# Patient Record
Sex: Female | Born: 1965 | Race: White | Hispanic: No | Marital: Single | Smoking: Current every day smoker
Health system: Southern US, Community
[De-identification: ages and names within clinical notes are randomized; demographics above are authoritative.]

## PROBLEM LIST (undated history)

## (undated) DIAGNOSIS — T8859XA Other complications of anesthesia, initial encounter: Secondary | ICD-10-CM

## (undated) DIAGNOSIS — T4145XA Adverse effect of unspecified anesthetic, initial encounter: Secondary | ICD-10-CM

## (undated) DIAGNOSIS — M1990 Unspecified osteoarthritis, unspecified site: Secondary | ICD-10-CM

## (undated) DIAGNOSIS — I10 Essential (primary) hypertension: Secondary | ICD-10-CM

## (undated) HISTORY — PX: KNEE ARTHROSCOPY: SUR90

## (undated) HISTORY — PX: TONSILLECTOMY: SUR1361

## (undated) HISTORY — PX: WRIST ARTHROPLASTY: SHX1088

## (undated) HISTORY — PX: CHOLECYSTECTOMY: SHX55

## (undated) HISTORY — PX: SHOULDER ARTHROSCOPY: SHX128

## (undated) HISTORY — PX: FRACTURE SURGERY: SHX138

## (undated) HISTORY — PX: ABDOMINAL HYSTERECTOMY: SHX81

## (undated) HISTORY — PX: APPENDECTOMY: SHX54

---

## 1998-06-18 ENCOUNTER — Ambulatory Visit (HOSPITAL_COMMUNITY): Admission: RE | Admit: 1998-06-18 | Discharge: 1998-06-18 | Payer: Self-pay | Admitting: Family Medicine

## 1998-06-18 ENCOUNTER — Encounter: Payer: Self-pay | Admitting: Family Medicine

## 1999-02-26 ENCOUNTER — Other Ambulatory Visit: Admission: RE | Admit: 1999-02-26 | Discharge: 1999-02-26 | Payer: Self-pay | Admitting: Obstetrics and Gynecology

## 1999-03-26 ENCOUNTER — Ambulatory Visit (HOSPITAL_COMMUNITY): Admission: RE | Admit: 1999-03-26 | Discharge: 1999-03-26 | Payer: Self-pay | Admitting: *Deleted

## 1999-03-26 ENCOUNTER — Encounter: Payer: Self-pay | Admitting: *Deleted

## 1999-07-23 ENCOUNTER — Ambulatory Visit (HOSPITAL_COMMUNITY): Admission: RE | Admit: 1999-07-23 | Discharge: 1999-07-23 | Payer: Self-pay | Admitting: Family Medicine

## 1999-07-23 ENCOUNTER — Encounter: Payer: Self-pay | Admitting: Family Medicine

## 1999-08-22 ENCOUNTER — Other Ambulatory Visit: Admission: RE | Admit: 1999-08-22 | Discharge: 1999-08-22 | Payer: Self-pay | Admitting: Obstetrics and Gynecology

## 2000-03-10 ENCOUNTER — Other Ambulatory Visit: Admission: RE | Admit: 2000-03-10 | Discharge: 2000-03-10 | Payer: Self-pay | Admitting: Obstetrics and Gynecology

## 2001-06-11 ENCOUNTER — Ambulatory Visit (HOSPITAL_COMMUNITY): Admission: RE | Admit: 2001-06-11 | Discharge: 2001-06-11 | Payer: Self-pay | Admitting: Orthopedic Surgery

## 2001-06-11 ENCOUNTER — Encounter: Payer: Self-pay | Admitting: Orthopedic Surgery

## 2001-07-27 ENCOUNTER — Ambulatory Visit (HOSPITAL_BASED_OUTPATIENT_CLINIC_OR_DEPARTMENT_OTHER): Admission: RE | Admit: 2001-07-27 | Discharge: 2001-07-27 | Payer: Self-pay | Admitting: Orthopedic Surgery

## 2001-08-06 ENCOUNTER — Other Ambulatory Visit: Admission: RE | Admit: 2001-08-06 | Discharge: 2001-08-06 | Payer: Self-pay | Admitting: Obstetrics and Gynecology

## 2002-03-09 ENCOUNTER — Ambulatory Visit (HOSPITAL_COMMUNITY): Admission: RE | Admit: 2002-03-09 | Discharge: 2002-03-09 | Payer: Self-pay | Admitting: Family Medicine

## 2002-03-09 ENCOUNTER — Encounter: Payer: Self-pay | Admitting: Family Medicine

## 2002-03-15 ENCOUNTER — Encounter: Payer: Self-pay | Admitting: Family Medicine

## 2002-03-15 ENCOUNTER — Ambulatory Visit (HOSPITAL_COMMUNITY): Admission: RE | Admit: 2002-03-15 | Discharge: 2002-03-15 | Payer: Self-pay | Admitting: Family Medicine

## 2002-08-11 ENCOUNTER — Encounter: Payer: Self-pay | Admitting: Obstetrics and Gynecology

## 2002-08-11 ENCOUNTER — Ambulatory Visit (HOSPITAL_COMMUNITY): Admission: RE | Admit: 2002-08-11 | Discharge: 2002-08-11 | Payer: Self-pay | Admitting: Obstetrics and Gynecology

## 2002-08-23 ENCOUNTER — Other Ambulatory Visit: Admission: RE | Admit: 2002-08-23 | Discharge: 2002-08-23 | Payer: Self-pay | Admitting: Obstetrics and Gynecology

## 2003-05-02 ENCOUNTER — Ambulatory Visit (HOSPITAL_COMMUNITY): Admission: RE | Admit: 2003-05-02 | Discharge: 2003-05-02 | Payer: Self-pay | Admitting: Orthopedic Surgery

## 2003-05-17 ENCOUNTER — Ambulatory Visit (HOSPITAL_BASED_OUTPATIENT_CLINIC_OR_DEPARTMENT_OTHER): Admission: RE | Admit: 2003-05-17 | Discharge: 2003-05-17 | Payer: Self-pay | Admitting: Orthopedic Surgery

## 2003-06-21 ENCOUNTER — Ambulatory Visit (HOSPITAL_BASED_OUTPATIENT_CLINIC_OR_DEPARTMENT_OTHER): Admission: RE | Admit: 2003-06-21 | Discharge: 2003-06-21 | Payer: Self-pay | Admitting: Orthopedic Surgery

## 2003-10-04 ENCOUNTER — Other Ambulatory Visit: Admission: RE | Admit: 2003-10-04 | Discharge: 2003-10-04 | Payer: Self-pay | Admitting: Obstetrics and Gynecology

## 2004-08-08 ENCOUNTER — Ambulatory Visit (HOSPITAL_BASED_OUTPATIENT_CLINIC_OR_DEPARTMENT_OTHER): Admission: RE | Admit: 2004-08-08 | Discharge: 2004-08-08 | Payer: Self-pay | Admitting: Orthopedic Surgery

## 2004-10-17 ENCOUNTER — Emergency Department: Payer: Self-pay | Admitting: General Practice

## 2004-11-07 ENCOUNTER — Other Ambulatory Visit: Admission: RE | Admit: 2004-11-07 | Discharge: 2004-11-07 | Payer: Self-pay | Admitting: Obstetrics and Gynecology

## 2005-02-28 ENCOUNTER — Other Ambulatory Visit: Payer: Self-pay

## 2005-02-28 ENCOUNTER — Emergency Department: Payer: Self-pay | Admitting: Unknown Physician Specialty

## 2005-08-12 ENCOUNTER — Ambulatory Visit (HOSPITAL_COMMUNITY): Admission: RE | Admit: 2005-08-12 | Discharge: 2005-08-12 | Payer: Self-pay | Admitting: Gastroenterology

## 2005-08-15 ENCOUNTER — Ambulatory Visit (HOSPITAL_COMMUNITY): Admission: RE | Admit: 2005-08-15 | Discharge: 2005-08-15 | Payer: Self-pay | Admitting: Gastroenterology

## 2006-01-12 ENCOUNTER — Encounter: Admission: RE | Admit: 2006-01-12 | Discharge: 2006-01-12 | Payer: Self-pay | Admitting: Obstetrics and Gynecology

## 2006-08-04 ENCOUNTER — Ambulatory Visit: Payer: Self-pay | Admitting: Urology

## 2011-02-20 ENCOUNTER — Observation Stay: Payer: Self-pay | Admitting: Student

## 2011-02-21 DIAGNOSIS — R079 Chest pain, unspecified: Secondary | ICD-10-CM

## 2011-03-07 ENCOUNTER — Encounter: Payer: Self-pay | Admitting: Cardiovascular Disease

## 2011-03-28 ENCOUNTER — Encounter: Payer: Self-pay | Admitting: Cardiovascular Disease

## 2011-04-10 ENCOUNTER — Encounter: Payer: Self-pay | Admitting: Cardiovascular Disease

## 2012-05-04 ENCOUNTER — Encounter (HOSPITAL_BASED_OUTPATIENT_CLINIC_OR_DEPARTMENT_OTHER): Payer: Self-pay | Admitting: *Deleted

## 2012-05-04 NOTE — Progress Notes (Signed)
Multiple surgeries-heartrate slow-has dropped to 28-keep atropine available.

## 2012-05-05 NOTE — H&P (Signed)
  [facility] [address]   [address] [place], [place] [address] [phone] A Division of [facility]  Loreta Ave, M.D.   Robert A. Thurston Hole, M.D.   Burnell Blanks, M.D.   Eulas Post, M.D.   Lunette Stands, M.D Buford Dresser, M.D.  Charlsie Quest, M.D.   Estell Harpin, M.D.   Melina Fiddler, M.D. Genene Churn. Barry Dienes, PA-C            Kirstin A. Shepperson, PA-C Josh [place], PA-C [place], [place]   RE: [place], [place]                                [id number]      DOB: December 14, 1965 PROGRESS NOTE: 03-12-12 Makaley comes in for follow up for continued symptoms in her right knee.  Seen and evaluated by me back in 2011.  I have also treated her back going almost ten years.  A picture of lateral tracking, chondromalacia patellae, lateral tethering.  Eventually coming down to operative intervention on the left knee with exam under anesthesia, excision plica, debridement of meniscus tear, chondroplasty and lateral retinacular release.  She did well on that side.  Improved symptoms.  She has continued to do well on that side.  The right knee has had continued marked significant retropatellar irritation and symptoms.  Getting more buckling, catching and giving way.  We discussed back 2 years ago exam under anesthesia, arthroscopy, chondroplasty and lateral release.  She has finally gotten to a point where she wants to consider that.  She is currently working, but has no insurance.  She would like to forgo any further workup or testing.  Otherwise in excellent health. Her history and general exam is outlined and included in the chart.    EXAMINATION: Specifically, very healthy appearing 46 year-old female.  A little bit of an antalgic gait on the right.  On the left she has full motion.  She has good strength.  Good stability.  Continues to have reasonable patella tracking without much crepitus and still feels freed up from her  lateral release done more than a decade ago.  On the right she has lateral tracking and tethering patellofemoral joint.  Grating and crepitus at least 3+.  A little bit sore over all her joint lines, but I am not getting a McMurray's.  Ligaments are stable.  Neurovascularly intact distally.  X-RAYS: I did repeat a three view x-ray today.  I don't see any loose bodies or other changes.    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  We have discussed definitive treatment.  Ideally I would like to get an MRI to look at all structures, but she does not want to do that.  Discussed exam under anesthesia, arthroscopy and chondroplasty.  In all likelihood adding lateral release.  Based on her age of 42 and her Q angle, I really don't think consideration of distal realignment with a Fulkerson should be entertained.  Would certainly try her scope and lateral release first.  I went over all of this with her and answered all her questions.  I will see her at the time of operative intervention.    Loreta Ave, M.D.   Electronically verified by Loreta Ave, M.D. DFM:jjh D 03-12-12 T 03-13-12

## 2012-05-06 ENCOUNTER — Encounter (HOSPITAL_BASED_OUTPATIENT_CLINIC_OR_DEPARTMENT_OTHER): Payer: Self-pay | Admitting: Certified Registered"

## 2012-05-06 ENCOUNTER — Ambulatory Visit (HOSPITAL_BASED_OUTPATIENT_CLINIC_OR_DEPARTMENT_OTHER): Payer: Self-pay | Admitting: Certified Registered"

## 2012-05-06 ENCOUNTER — Encounter (HOSPITAL_BASED_OUTPATIENT_CLINIC_OR_DEPARTMENT_OTHER): Payer: Self-pay

## 2012-05-06 ENCOUNTER — Encounter (HOSPITAL_BASED_OUTPATIENT_CLINIC_OR_DEPARTMENT_OTHER)
Admission: RE | Disposition: A | Discharge status: Home / Self Care | Payer: Self-pay | Source: Ambulatory Visit | Attending: Orthopedic Surgery

## 2012-05-06 ENCOUNTER — Ambulatory Visit (HOSPITAL_BASED_OUTPATIENT_CLINIC_OR_DEPARTMENT_OTHER)
Admission: RE | Admit: 2012-05-06 | Discharge: 2012-05-06 | Disposition: A | Discharge status: Home / Self Care | Payer: Self-pay | Source: Ambulatory Visit | Attending: Orthopedic Surgery | Admitting: Orthopedic Surgery

## 2012-05-06 DIAGNOSIS — Z9889 Other specified postprocedural states: Secondary | ICD-10-CM

## 2012-05-06 DIAGNOSIS — M6750 Plica syndrome, unspecified knee: Secondary | ICD-10-CM | POA: Insufficient documentation

## 2012-05-06 DIAGNOSIS — M2240 Chondromalacia patellae, unspecified knee: Secondary | ICD-10-CM | POA: Insufficient documentation

## 2012-05-06 HISTORY — DX: Unspecified osteoarthritis, unspecified site: M19.90

## 2012-05-06 HISTORY — DX: Other complications of anesthesia, initial encounter: T88.59XA

## 2012-05-06 HISTORY — DX: Adverse effect of unspecified anesthetic, initial encounter: T41.45XA

## 2012-05-06 HISTORY — PX: KNEE ARTHROSCOPY WITH LATERAL RELEASE: SHX5649

## 2012-05-06 SURGERY — ARTHROSCOPY, KNEE, WITH LATERAL RETINACULUM RELEASE
Anesthesia: General | Laterality: Right

## 2012-05-06 MED ORDER — ONDANSETRON HCL 4 MG/2ML IJ SOLN: 4.0000 mg | Freq: Once | INTRAMUSCULAR | Status: DC | PRN

## 2012-05-06 MED ORDER — ONDANSETRON HCL 4 MG/2ML IJ SOLN
INTRAMUSCULAR | Status: DC | PRN
  Administered 2012-05-06: 4 mg via INTRAVENOUS

## 2012-05-06 MED ORDER — PROPOFOL 10 MG/ML IV BOLUS
INTRAVENOUS | Status: DC | PRN
  Administered 2012-05-06: 180 mg via INTRAVENOUS

## 2012-05-06 MED ORDER — GLYCOPYRROLATE 0.2 MG/ML IJ SOLN
INTRAMUSCULAR | Status: DC | PRN
  Administered 2012-05-06: 0.2 mg via INTRAVENOUS

## 2012-05-06 MED ORDER — FENTANYL CITRATE 0.05 MG/ML IJ SOLN
INTRAMUSCULAR | Status: DC | PRN
  Administered 2012-05-06 (×2): 25 ug via INTRAVENOUS
  Administered 2012-05-06: 100 ug via INTRAVENOUS

## 2012-05-06 MED ORDER — HYDROMORPHONE HCL PF 1 MG/ML IJ SOLN
0.2500 mg | INTRAMUSCULAR | Status: DC | PRN
  Administered 2012-05-06 (×2): 0.5 mg via INTRAVENOUS

## 2012-05-06 MED ORDER — LIDOCAINE HCL (CARDIAC) 20 MG/ML IV SOLN
INTRAVENOUS | Status: DC | PRN
  Administered 2012-05-06: 60 mg via INTRAVENOUS

## 2012-05-06 MED ORDER — OXYCODONE HCL 5 MG PO TABS
5.0000 mg | ORAL_TABLET | Freq: Once | ORAL | Status: AC | PRN
  Administered 2012-05-06: 5 mg via ORAL

## 2012-05-06 MED ORDER — OXYCODONE HCL 5 MG/5ML PO SOLN: 5.0000 mg | Freq: Once | ORAL | Status: AC | PRN

## 2012-05-06 MED ORDER — SODIUM CHLORIDE 0.9 % IR SOLN
Status: DC | PRN
  Administered 2012-05-06: 3000 mL

## 2012-05-06 MED ORDER — BUPIVACAINE HCL (PF) 0.25 % IJ SOLN
INTRAMUSCULAR | Status: DC | PRN
  Administered 2012-05-06: 20 mL

## 2012-05-06 MED ORDER — CHLORHEXIDINE GLUCONATE 4 % EX LIQD: 60.0000 mL | Freq: Once | CUTANEOUS | Status: DC

## 2012-05-06 MED ORDER — LACTATED RINGERS IV SOLN
INTRAVENOUS | Status: DC
  Administered 2012-05-06 (×2): via INTRAVENOUS

## 2012-05-06 MED ORDER — CEFAZOLIN SODIUM-DEXTROSE 2-3 GM-% IV SOLR
2.0000 g | INTRAVENOUS | Status: AC
  Administered 2012-05-06: 2 g via INTRAVENOUS

## 2012-05-06 MED ORDER — DEXAMETHASONE SODIUM PHOSPHATE 4 MG/ML IJ SOLN
INTRAMUSCULAR | Status: DC | PRN
  Administered 2012-05-06: 10 mg via INTRAVENOUS

## 2012-05-06 MED ORDER — OXYCODONE-ACETAMINOPHEN 5-325 MG PO TABS: 1.0000 | ORAL_TABLET | ORAL | Status: DC | PRN

## 2012-05-06 MED ORDER — MIDAZOLAM HCL 5 MG/5ML IJ SOLN
INTRAMUSCULAR | Status: DC | PRN
  Administered 2012-05-06: 2 mg via INTRAVENOUS

## 2012-05-06 SURGICAL SUPPLY — 42 items
BANDAGE ELASTIC 6 VELCRO ST LF (GAUZE/BANDAGES/DRESSINGS) ×2 IMPLANT
BLADE CUDA 5.5 (BLADE) IMPLANT
BLADE CUDA GRT WHITE 3.5 (BLADE) IMPLANT
BLADE CUTTER GATOR 3.5 (BLADE) ×2 IMPLANT
BLADE CUTTER MENIS 5.5 (BLADE) IMPLANT
BLADE GREAT WHITE 4.2 (BLADE) ×2 IMPLANT
BUR OVAL 4.0 (BURR) IMPLANT
CANISTER OMNI JUG 16 LITER (MISCELLANEOUS) ×2 IMPLANT
CANISTER SUCTION 2500CC (MISCELLANEOUS) IMPLANT
CLOTH BEACON ORANGE TIMEOUT ST (SAFETY) ×2 IMPLANT
CUTTER MENISCUS  4.2MM (BLADE)
CUTTER MENISCUS 4.2MM (BLADE) IMPLANT
DRAPE ARTHROSCOPY W/POUCH 90 (DRAPES) ×2 IMPLANT
DRSG PAD ABDOMINAL 8X10 ST (GAUZE/BANDAGES/DRESSINGS) ×1 IMPLANT
DURAPREP 26ML APPLICATOR (WOUND CARE) ×2 IMPLANT
ELECT MENISCUS 165MM 90D (ELECTRODE) ×1 IMPLANT
ELECT REM PT RETURN 9FT ADLT (ELECTROSURGICAL) ×2
ELECTRODE REM PT RTRN 9FT ADLT (ELECTROSURGICAL) IMPLANT
GAUZE XEROFORM 1X8 LF (GAUZE/BANDAGES/DRESSINGS) ×2 IMPLANT
GLOVE BIOGEL PI IND STRL 7.5 (GLOVE) ×1 IMPLANT
GLOVE BIOGEL PI IND STRL 8 (GLOVE) ×1 IMPLANT
GLOVE BIOGEL PI INDICATOR 7.5 (GLOVE) ×1
GLOVE BIOGEL PI INDICATOR 8 (GLOVE) ×2
GLOVE ORTHO TXT STRL SZ7.5 (GLOVE) ×4 IMPLANT
GOWN PREVENTION PLUS XLARGE (GOWN DISPOSABLE) ×2 IMPLANT
GOWN STRL REIN 2XL XLG LVL4 (GOWN DISPOSABLE) ×2 IMPLANT
HOLDER KNEE FOAM BLUE (MISCELLANEOUS) ×2 IMPLANT
IMMOBILIZER KNEE 24 THIGH 36 (MISCELLANEOUS) ×1 IMPLANT
IMMOBILIZER KNEE 24 UNIV (MISCELLANEOUS) ×2
KNEE WRAP E Z 3 GEL PACK (MISCELLANEOUS) ×1 IMPLANT
PACK ARTHROSCOPY DSU (CUSTOM PROCEDURE TRAY) ×2 IMPLANT
PACK BASIN DAY SURGERY FS (CUSTOM PROCEDURE TRAY) ×2 IMPLANT
PAD CAST 4YDX4 CTTN HI CHSV (CAST SUPPLIES) IMPLANT
PADDING CAST COTTON 4X4 STRL (CAST SUPPLIES) ×2
PENCIL BUTTON HOLSTER BLD 10FT (ELECTRODE) ×1 IMPLANT
SET ARTHROSCOPY TUBING (MISCELLANEOUS) ×2
SET ARTHROSCOPY TUBING LN (MISCELLANEOUS) ×1 IMPLANT
SPONGE GAUZE 4X4 12PLY (GAUZE/BANDAGES/DRESSINGS) ×4 IMPLANT
SUT ETHILON 3 0 PS 1 (SUTURE) ×2 IMPLANT
SUT VIC AB 3-0 FS2 27 (SUTURE) IMPLANT
TOWEL OR 17X24 6PK STRL BLUE (TOWEL DISPOSABLE) ×2 IMPLANT
WATER STERILE IRR 1000ML POUR (IV SOLUTION) ×1 IMPLANT

## 2012-05-06 NOTE — Anesthesia Preprocedure Evaluation (Signed)

## 2012-05-06 NOTE — Brief Op Note (Signed)
05/06/2012  1:05 PM  PATIENT:  Jamie Johnson  46 y.o. female  PRE-OPERATIVE DIAGNOSIS:  RIGHT KNEE DEGENERATIVE ARTHRITIS/CHONDROMALACIA  POST-OPERATIVE DIAGNOSIS:  RIGHT KNEE DEGENERATIVE ARTHRITIS/CHONDROMALACIA  PROCEDURE:  Procedure(s) (LRB) with comments: KNEE ARTHROSCOPY WITH LATERAL RELEASE (Right) - RIGHT KNEE ARTHROSCOPY WITH LATERAL RELEASE ,Excision Plica, Chondroplasty   SURGEON:  Surgeon(s) and Role:    * Loreta Ave, MD - Primary  PHYSICIAN ASSISTANT: Zonia Kief M   ANESTHESIA:   general  EBL:  Total I/O In: 1844 [P.O.:444; I.V.:1400] Out: -   SPECIMEN:  No Specimen  DISPOSITION OF SPECIMEN:  N/A   PATIENT DISPOSITION:  PACU - hemodynamically stable.

## 2012-05-06 NOTE — Anesthesia Procedure Notes (Signed)
Procedure Name: LMA Insertion Date/Time: 05/06/2012 7:36 AM Performed by: Verlan Friends Pre-anesthesia Checklist: Patient identified, Emergency Drugs available, Suction available, Patient being monitored and Timeout performed Patient Re-evaluated:Patient Re-evaluated prior to inductionOxygen Delivery Method: Circle System Utilized Preoxygenation: Pre-oxygenation with 100% oxygen Intubation Type: IV induction Ventilation: Mask ventilation without difficulty LMA: LMA inserted LMA Size: 4.0 Number of attempts: 1 Airway Equipment and Method: bite block Placement Confirmation: positive ETCO2 Tube secured with: Tape Dental Injury: Teeth and Oropharynx as per pre-operative assessment

## 2012-05-06 NOTE — Transfer of Care (Signed)
Immediate Anesthesia Transfer of Care Note  Patient: Jamie Johnson  Procedure(s) Performed: Procedure(s) (LRB) with comments: KNEE ARTHROSCOPY WITH LATERAL RELEASE (Right) - RIGHT KNEE ARTHROSCOPY WITH LATERAL RELEASE ,Excision Plica, Chondroplasty   Patient Location: PACU  Anesthesia Type:General  Level of Consciousness: awake, alert , oriented and patient cooperative  Airway & Oxygen Therapy: Patient Spontanous Breathing and Patient connected to face mask oxygen  Post-op Assessment: Report given to PACU RN and Post -op Vital signs reviewed and stable  Post vital signs: Reviewed and stable  Complications: No apparent anesthesia complications

## 2012-05-06 NOTE — Interval H&P Note (Signed)
History and Physical Interval Note:  05/06/2012 7:24 AM  Jamie Johnson  has presented today for surgery, with the diagnosis of RIGHT KNEE DEGENERATIVE ARTHRITIS/CHONDROMALACIA/PATELLA  The various methods of treatment have been discussed with the patient and family. After consideration of risks, benefits and other options for treatment, the patient has consented to  Procedure(s) (LRB) with comments: KNEE ARTHROSCOPY WITH LATERAL RELEASE (Right) - RIGHT KNEE ARTHROSCOPY WITH LATERAL RELEASE,DEBRIDEMENT,SHAVING (SHONDROPLASTY)  as a surgical intervention .  The patient's history has been reviewed, patient examined, no change in status, stable for surgery.  I have reviewed the patient's chart and labs.  Questions were answered to the patient's satisfaction.     [name]

## 2012-05-06 NOTE — Anesthesia Postprocedure Evaluation (Signed)
  Anesthesia Post-op Note  Patient: Jamie Johnson  Procedure(s) Performed: Procedure(s) (LRB) with comments: KNEE ARTHROSCOPY WITH LATERAL RELEASE (Right) - RIGHT KNEE ARTHROSCOPY WITH LATERAL RELEASE ,Excision Plica, Chondroplasty   Patient Location: PACU  Anesthesia Type:General  Level of Consciousness: awake, alert  and oriented  Airway and Oxygen Therapy: Patient Spontanous Breathing and Patient connected to face mask oxygen  Post-op Pain: mild  Post-op Assessment: Post-op Vital signs reviewed  Post-op Vital Signs: Reviewed  Complications: No apparent anesthesia complications

## 2012-05-07 ENCOUNTER — Encounter (HOSPITAL_BASED_OUTPATIENT_CLINIC_OR_DEPARTMENT_OTHER): Payer: Self-pay | Admitting: Orthopedic Surgery

## 2012-05-07 NOTE — Op Note (Signed)
NAMEJANNICE, Jamie Johnson NO.:  [id number]  MEDICAL RECORD NO.:  [id number]  LOCATION:                                 FACILITY:  PHYSICIAN:  Loreta Ave, M.D.      DATE OF BIRTH:  DATE OF PROCEDURE:  05/06/2012 DATE OF DISCHARGE:                              OPERATIVE REPORT   PREOPERATIVE DIAGNOSIS:  Right knee chondromalacia of patella with lateral tracking, tethering patellofemoral joint.  Medial plica.  POSTOPERATIVE DIAGNOSES:  Right knee chondromalacia of patella with lateral tracking, tethering patellofemoral joint.  Medial plica.  Grade 2 and 3 changes in peak of the patella.  PROCEDURE:  Right knee exam under anesthesia, arthroscopy. Chondroplasty of patella.  Excision of medial plica.  Arthroscopic lateral retinacular release.  SURGEON:  Loreta Ave, M.D.  ASSISTANT:  Genene Churn. Denton Meek.  ANESTHESIA:  General.  BLOOD LOSS:  Minimal.  SPECIMENS:  None.  CULTURES:  None.  COMPLICATION:  None.  DRESSINGS:  Soft compressive with lateral bolster.  PROCEDURE:  The patient was brought to the operating room, placed on the operating table in supine position.  After adequate anesthesia had been obtained, knee examined.  Full motion with stable ligamentous.  Lateral tracking, lateral tethering confirmed.  No instability.  Leg holder applied.  Leg was prepped and draped in usual sterile fashion.  Two portals; one in each medial and lateral parapatellar.  Arthroscope was introduced.  Knee was distended and inspected.  Confirmed lateral tracking and tethering.  Focal grade 2 and 3 changes in peak of the patella debrided.  Large fibrotic medial plica excised.  Trochlea intact.  Remaining knee looked good with the exception of some mild changes in lateral tibial plateau, grade 2.  Medial meniscus, lateral meniscus, cruciate ligaments intact.  After chondroplasty and plica excision, tracking assessed from all angles.  Sufficient tethering  and torn released.  Cautery was used to release the lateral retinaculum from the vastus lateralis superiorly down the lateral joint line inferiorly. Marked improvement of tethering after release.  Instruments and fluids were removed. Portals were closed with nylon.  Knee was injected with Marcaine. Sterile compressive dressing was applied with lateral bolster. Anesthesia was reversed.  Brought to the recovery room.  Tolerated the surgery well.  No complications.     Loreta Ave, M.D.     DFM/MEDQ  D:  05/06/2012  T:  05/07/2012  Job:  [id number]

## 2012-12-06 ENCOUNTER — Other Ambulatory Visit (HOSPITAL_COMMUNITY): Payer: Self-pay | Admitting: Obstetrics and Gynecology

## 2012-12-06 DIAGNOSIS — R7989 Other specified abnormal findings of blood chemistry: Secondary | ICD-10-CM

## 2012-12-09 ENCOUNTER — Other Ambulatory Visit (HOSPITAL_COMMUNITY): Payer: Self-pay

## 2012-12-13 ENCOUNTER — Ambulatory Visit (HOSPITAL_COMMUNITY)
Admission: RE | Admit: 2012-12-13 | Discharge: 2012-12-13 | Disposition: A | Discharge status: Home / Self Care | Payer: BC Managed Care – PPO | Source: Ambulatory Visit | Attending: Obstetrics and Gynecology | Admitting: Obstetrics and Gynecology

## 2012-12-13 DIAGNOSIS — R599 Enlarged lymph nodes, unspecified: Secondary | ICD-10-CM | POA: Insufficient documentation

## 2012-12-13 DIAGNOSIS — R946 Abnormal results of thyroid function studies: Secondary | ICD-10-CM | POA: Insufficient documentation

## 2012-12-13 DIAGNOSIS — E041 Nontoxic single thyroid nodule: Secondary | ICD-10-CM | POA: Insufficient documentation

## 2012-12-13 DIAGNOSIS — R7989 Other specified abnormal findings of blood chemistry: Secondary | ICD-10-CM

## 2013-03-03 ENCOUNTER — Ambulatory Visit: Payer: Self-pay | Admitting: Anesthesiology

## 2013-03-15 ENCOUNTER — Ambulatory Visit: Payer: Self-pay | Admitting: Unknown Physician Specialty

## 2014-10-06 NOTE — Op Note (Signed)
PATIENT NAMOla Johnson:  Azbell, Casha MR#:  [id number] DATE OF BIRTH:  07-10-1965  DATE OF PROCEDURE:  03/15/2013  PREOPERATIVE DIAGNOSIS: Right thyroid nodule.  POSTOPERATIVE DIAGNOSIS: Right thyroid nodule.  ATTENDING SURGEON: Davina Pokehapman T. Federico Maiorino, M.D.   ASSISTANT SURGEON: Wardell HeathBennet.  OPERATION PERFORMED:  1. Right hemithyroidectomy.  2. Laryngeal nerve monitoring.  OPERATIVE FINDINGS: Relatively normal appearing, slightly enlarged right lobe of the thyroid with a intrathyroid nodule measuring approximately 1 cm   DESCRIPTION OF PROCEDURE: Marylene Landngela was identified in the holding area, taken to the operating room and placed in the supine position. After general endotracheal anesthesia with a laryngeal nerve monitor. Once intubated and in position the neck was gently extended and the anterior neck was identified and marked. A local anesthetic of 1% lidocaine with 1:100,000 epinephrine was used to inject along the skin crease tension line. Approximately 4 mL were used. The neck was then prepped and draped sterilely, a 15 blade was used to incise down to and through the platysma muscle. Hemostasis was achieved using the Bovie cautery. The midline was identified and gently divided using the Harmonic scalpel. On the right-hand side the strap muscles were gently elevated away from the right lobe of the thyroid. There were several feeding vessels which were divided using the Harmonic scalpel. The superior pole of thyroid gland was identified and isolated, and the superior pole vessels were divided using the Harmonic scalpel. The gland was then gently medialized. There were several feeding vessels laterally which were also divided using the Harmonic scalpel. The superior and inferior parathyroid glands were identified and left on their vascular pedicles intact. The recurrent laryngeal nerve was also identified in the tracheoesophageal groove. This was stimulated and left intact throughout the case. Once the nerve was  identified and traced up to its entrance the larynx, the gland was retracted medially, Berry's ligament was released. There were several other medial vessels which were also divided using the Harmonic scalpel. The gland was then peeled off the anterior tracheal wall. The inferior pole vessels were also identified and divided using the Harmonic scalpel. As the gland was peeled more towards the left the isthmus was identified. The gland was then divided right from left lobe including the isthmus in the specimen. This was divided using the Harmonic scalpel. A stitch was placed in the superior pole on the right. This was then sent for permanent section. The right neck was then copiously irrigated with saline. Any small bleeding areas were cauterized using the microbipolar. At the end of the case the nerve was stimulated and was intact and stimulated at the end of the case. A #10 TLS drain was brought out of wound inferiorly. A small piece of Surgicel was placed in the neurovascular bed. The strap muscles were reapproximated in the midline using 4-0 Vicryl. The platysmal layer was closed using 4-0 Vicryl. The subcutaneous layers and skin were closed using 4-0 Vicryl and the epidermis was closed using Dermabond. A Tegaderm was placed over the drain to keep it in position. The patient was then returned to anesthesia where she was awakened in the operating room and taken to the recovery room in stable condition.   CULTURES: None.   SPECIMENS: Right thyroid lobe.   ESTIMATED BLOOD LOSS: Less than 20 mL.   ____________________________ Davina Pokehapman T. Wenzel Backlund, MD ctm:sg D: 03/15/2013 09:55:39 ET T: 03/15/2013 11:13:51 ET JOB#: [id number]  cc: Davina Pokehapman T. Karem Tomaso, MD, <Dictator> Davina PokeHAPMAN T Shan Valdes MD ELECTRONICALLY SIGNED 03/29/2013 9:43

## 2014-12-01 ENCOUNTER — Other Ambulatory Visit: Payer: Self-pay | Admitting: Family Medicine

## 2014-12-01 DIAGNOSIS — E039 Hypothyroidism, unspecified: Secondary | ICD-10-CM

## 2015-01-03 ENCOUNTER — Other Ambulatory Visit: Payer: Self-pay | Admitting: Family Medicine

## 2015-01-03 DIAGNOSIS — E039 Hypothyroidism, unspecified: Secondary | ICD-10-CM

## 2015-01-03 NOTE — Telephone Encounter (Signed)
Last ov 10/2014  Thanks,   -Kenadie Royce  

## 2015-01-16 ENCOUNTER — Ambulatory Visit
Admission: RE | Admit: 2015-01-16 | Discharge: 2015-01-16 | Disposition: A | Discharge status: Home / Self Care | Payer: 59 | Source: Ambulatory Visit | Attending: Family Medicine | Admitting: Family Medicine

## 2015-01-16 ENCOUNTER — Ambulatory Visit (INDEPENDENT_AMBULATORY_CARE_PROVIDER_SITE_OTHER): Payer: 59 | Admitting: Family Medicine

## 2015-01-16 ENCOUNTER — Encounter: Payer: Self-pay | Admitting: Family Medicine

## 2015-01-16 VITALS — BP 122/68 | HR 60 | Temp 97.8°F | Resp 16 | Ht 68.0 in | Wt 165.0 lb

## 2015-01-16 DIAGNOSIS — F1911 Other psychoactive substance abuse, in remission: Secondary | ICD-10-CM | POA: Insufficient documentation

## 2015-01-16 DIAGNOSIS — J309 Allergic rhinitis, unspecified: Secondary | ICD-10-CM | POA: Insufficient documentation

## 2015-01-16 DIAGNOSIS — F17200 Nicotine dependence, unspecified, uncomplicated: Secondary | ICD-10-CM | POA: Insufficient documentation

## 2015-01-16 DIAGNOSIS — R001 Bradycardia, unspecified: Secondary | ICD-10-CM | POA: Insufficient documentation

## 2015-01-16 DIAGNOSIS — M5441 Lumbago with sciatica, right side: Secondary | ICD-10-CM | POA: Diagnosis not present

## 2015-01-16 DIAGNOSIS — F419 Anxiety disorder, unspecified: Secondary | ICD-10-CM | POA: Insufficient documentation

## 2015-01-16 DIAGNOSIS — M5136 Other intervertebral disc degeneration, lumbar region: Secondary | ICD-10-CM | POA: Insufficient documentation

## 2015-01-16 DIAGNOSIS — E041 Nontoxic single thyroid nodule: Secondary | ICD-10-CM | POA: Insufficient documentation

## 2015-01-16 DIAGNOSIS — F1011 Alcohol abuse, in remission: Secondary | ICD-10-CM | POA: Insufficient documentation

## 2015-01-16 DIAGNOSIS — M545 Low back pain, unspecified: Secondary | ICD-10-CM | POA: Insufficient documentation

## 2015-01-16 DIAGNOSIS — G4700 Insomnia, unspecified: Secondary | ICD-10-CM | POA: Insufficient documentation

## 2015-01-16 LAB — POCT URINALYSIS DIPSTICK
BILIRUBIN UA: NEGATIVE
Glucose, UA: NEGATIVE
KETONES UA: NEGATIVE
LEUKOCYTES UA: NEGATIVE
NITRITE UA: NEGATIVE
PH UA: 5
Protein, UA: NEGATIVE
RBC UA: NEGATIVE
UROBILINOGEN UA: 0.2

## 2015-01-16 MED ORDER — CYCLOBENZAPRINE HCL 10 MG PO TABS: 10.0000 mg | ORAL_TABLET | Freq: Every day | ORAL | Status: DC

## 2015-01-16 MED ORDER — NAPROXEN 500 MG PO TABS: 500.0000 mg | ORAL_TABLET | Freq: Two times a day (BID) | ORAL | Status: DC

## 2015-01-16 NOTE — Progress Notes (Signed)
Subjective:    Patient ID: Jamie Johnson, female    DOB: Sep 19, 1965, 49 y.o.   MRN: [id number]  Back Pain This is a new problem. The current episode started in the past 7 days. The problem occurs constantly. The problem has been gradually worsening since onset. The pain is present in the lumbar spine and gluteal. The quality of the pain is described as aching. Radiates to: right buttock. The pain is at a severity of 5/10. The pain is moderate. The pain is worse during the day. The symptoms are aggravated by sitting. Associated symptoms include abdominal pain and leg pain. Pertinent negatives include no bladder incontinence, bowel incontinence, chest pain, dysuria, fever, headaches, numbness, paresthesias, pelvic pain, weakness or weight loss. She has tried NSAIDs and analgesics (hydrocodone) for the symptoms. The treatment provided no relief.   No known trauma. First noticed it Friday.  No changes.  Started after bending and then noticed it.   Felt just not right, tight and has worsened since then.    Review of Systems  Constitutional: Negative for fever and weight loss.  Cardiovascular: Negative for chest pain.  Gastrointestinal: Positive for abdominal pain. Negative for bowel incontinence.  Genitourinary: Negative for bladder incontinence, dysuria and pelvic pain.  Musculoskeletal: Positive for back pain.  Neurological: Negative for weakness, numbness, headaches and paresthesias.   BP 122/68 mmHg  Pulse 60  Temp(Src) 97.8 F (36.6 C) (Oral)  Resp 16  Ht 5\' 8"  (1.727 m)  Wt 165 lb (74.844 kg)  BMI 25.09 kg/m2   Patient Active Problem List   Diagnosis Date Noted  . Allergic rhinitis 01/16/2015  . Anxiety 01/16/2015  . H/O alcohol abuse 01/16/2015  . H/O drug abuse 01/16/2015  . Cannot sleep 01/16/2015  . Thyroid nodule 01/16/2015  . Compulsive tobacco user syndrome 01/16/2015  . Bradycardia, sinus 01/16/2015  . Hypothyroidism 12/01/2014   Past Medical History  Diagnosis Date   . Complication of anesthesia     has very slow heartrate-drops with any anesthesia to 28  . Arthritis   . DJD (degenerative joint disease)    Current Outpatient Prescriptions on File Prior to Visit  Medication Sig  . estradiol (ESTRACE) 0.5 MG tablet Take 0.5 mg by mouth daily.  Marland Kitchen levothyroxine (SYNTHROID, LEVOTHROID) 88 MCG tablet TAKE 1 TABLET BY MOUTH DAILY   No current facility-administered medications on file prior to visit.   Allergies  Allergen Reactions  . Pseudoephedrine     Makes her fell dizzy-spacy   Past Surgical History  Procedure Laterality Date  . Abdominal hysterectomy      TAH  . Knee arthroscopy      left  . Tonsillectomy    . Cholecystectomy    . Shoulder arthroscopy      right  . Shoulder arthroscopy      left  . Appendectomy    . Fracture surgery      right foot  . Wrist arthroplasty      left  . Knee arthroscopy with lateral release  05/06/2012    Procedure: KNEE ARTHROSCOPY WITH LATERAL RELEASE;  Surgeon: Loreta Ave, MD;  Location: [facility];  Service: Orthopedics;  Laterality: Right;  RIGHT KNEE ARTHROSCOPY WITH LATERAL RELEASE ,Excision Plica, Chondroplasty    History   Social History  . Marital Status: Single    Spouse Name: N/A  . Number of Children: N/A  . Years of Education: N/A   Occupational History  . Not on file.  Social History Main Topics  . Smoking status: Current Every Day Smoker -- 0.25 packs/day  . Smokeless tobacco: Never Used  . Alcohol Use: Yes     Comment: occ  . Drug Use: No  . Sexual Activity: Not on file   Other Topics Concern  . Not on file   Social History Narrative   Family History  Problem Relation Age of Onset  . Pulmonary fibrosis Mother   . COPD Mother   . Asthma Mother   . Stroke Father   . Heart disease Brother        Objective:   Physical Exam  Constitutional: She is oriented to person, place, and time. She appears well-developed and well-nourished.   Cardiovascular: Normal rate and regular rhythm.   Pulmonary/Chest: Effort normal and breath sounds normal.  Musculoskeletal: Normal range of motion. She exhibits tenderness. She exhibits no edema.  Tender mid-back and low back at right SI joint. Straight leg raises negative.   Neurological: She is alert and oriented to person, place, and time.   BP 122/68 mmHg  Pulse 60  Temp(Src) 97.8 F (36.6 C) (Oral)  Resp 16  Ht  (1.727 m)  Wt 165 lb (74.844 kg)  BMI 25.09 kg/m2      Assessment & Plan:  1. Bilateral low back pain with right-sided sciatica Suspect musculoskeletal. Will treat and check CXR. Further plan if worsens or does not improve.  - POCT Urinalysis Dipstick - DG Lumbar Spine Complete; Future - naproxen (NAPROSYN) 500 MG tablet; Take 1 tablet (500 mg total) by mouth 2 (two) times daily with a meal.  Dispense: 60 tablet; Refill: 0 - cyclobenzaprine (FLEXERIL) 10 MG tablet; Take 1 tablet (10 mg total) by mouth at bedtime.  Dispense: 30 tablet; Refill: 0 Results for orders placed or performed in visit on 01/16/15  POCT Urinalysis Dipstick  Result Value Ref Range   Color, UA yellow    Clarity, UA clear    Glucose, UA negative    Bilirubin, UA negative    Ketones, UA negative    Spec Grav, UA >=1.030    Blood, UA negative    pH, UA 5.0    Protein, UA negative    Urobilinogen, UA 0.2    Nitrite, UA negative    Leukocytes, UA Negative Negative

## 2015-01-17 ENCOUNTER — Telehealth: Payer: Self-pay

## 2015-01-17 NOTE — Telephone Encounter (Signed)
Pt advised as directed below.   Thanks,   -Angles Trevizo  

## 2015-01-17 NOTE — Telephone Encounter (Signed)
-----   Message from Lorie Phenix, MD sent at 01/17/2015  9:19 AM EDT ----- Degenerative disc disease as suspected. No other acute abnormalities. Call for orthopedic referral if does not improve over the next few weeks.  Thanks.

## 2015-01-24 ENCOUNTER — Other Ambulatory Visit: Payer: Self-pay

## 2015-01-24 DIAGNOSIS — E039 Hypothyroidism, unspecified: Secondary | ICD-10-CM

## 2015-01-24 DIAGNOSIS — Z780 Asymptomatic menopausal state: Secondary | ICD-10-CM | POA: Insufficient documentation

## 2015-01-24 MED ORDER — LEVOTHYROXINE SODIUM 88 MCG PO TABS: 88.0000 ug | ORAL_TABLET | Freq: Every day | ORAL | Status: DC

## 2015-01-24 MED ORDER — ESTRADIOL 0.5 MG PO TABS: 0.5000 mg | ORAL_TABLET | Freq: Every day | ORAL | Status: DC

## 2015-02-02 ENCOUNTER — Telehealth: Payer: Self-pay | Admitting: Family Medicine

## 2015-02-02 NOTE — Telephone Encounter (Signed)
Pt said she received her estradiol.  It was only 05 mg but she take .  This RX came from [facility].  She said they are faxing a request for the .    She just wanted to let you know so when you recd. The request you would know why.  If you need to call her her CB is [phone]  Thanks Barth Kirks

## 2015-02-05 MED ORDER — ESTRADIOL 1 MG PO TABS: 1.0000 mg | ORAL_TABLET | Freq: Every day | ORAL | Status: DC

## 2018-09-15 ENCOUNTER — Other Ambulatory Visit: Payer: Self-pay

## 2018-09-15 ENCOUNTER — Emergency Department
Admission: EM | Admit: 2018-09-15 | Discharge: 2018-09-15 | Disposition: A | Discharge status: Home / Self Care | Payer: 59 | Attending: Emergency Medicine | Admitting: Emergency Medicine

## 2018-09-15 DIAGNOSIS — F17210 Nicotine dependence, cigarettes, uncomplicated: Secondary | ICD-10-CM | POA: Insufficient documentation

## 2018-09-15 DIAGNOSIS — T7421XA Adult sexual abuse, confirmed, initial encounter: Secondary | ICD-10-CM | POA: Insufficient documentation

## 2018-09-15 NOTE — ED Notes (Signed)
Pt stated that she does not want to wait for the SANE nurse. Dr. Dolores Frame made aware and charge nurse Raquel made aware and she called the SANE nurse to informed her.

## 2018-09-15 NOTE — SANE Note (Addendum)
FNE received call at approximately 0430 from charge nurse, Raquel.  Raquel informed FNE that a patient was en route via EMS.  FNE received another call at approximately 0515 from charge nurse, Raquel.  Raquel informed FNE that patient requested to be discharged and did not want to be seen by FNE or the services of Forensic Nursing.

## 2018-09-15 NOTE — ED Notes (Signed)
[facility] and SANE nurse have been contacted.

## 2018-09-15 NOTE — ED Notes (Signed)
[facility] called and is speaking to patient at this time to provider her with resources.

## 2018-09-15 NOTE — Discharge Instructions (Addendum)
Please follow the instructions from the forensic nurse.  Return to the ER for worsening symptoms, persistent vomiting, difficulty breathing or other concerns.
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
## 2019-05-31 ENCOUNTER — Ambulatory Visit
Admission: RE | Admit: 2019-05-31 | Discharge: 2019-05-31 | Disposition: A | Discharge status: Home / Self Care | Payer: Self-pay | Source: Ambulatory Visit | Attending: Unknown Physician Specialty | Admitting: Unknown Physician Specialty

## 2019-05-31 ENCOUNTER — Other Ambulatory Visit: Payer: Self-pay

## 2019-05-31 ENCOUNTER — Other Ambulatory Visit: Payer: Self-pay | Admitting: Unknown Physician Specialty

## 2019-05-31 DIAGNOSIS — R059 Cough, unspecified: Secondary | ICD-10-CM

## 2019-05-31 DIAGNOSIS — R05 Cough: Secondary | ICD-10-CM

## 2019-05-31 DIAGNOSIS — J329 Chronic sinusitis, unspecified: Secondary | ICD-10-CM

## 2019-08-12 DIAGNOSIS — G8918 Other acute postprocedural pain: Secondary | ICD-10-CM | POA: Insufficient documentation

## 2019-11-15 ENCOUNTER — Other Ambulatory Visit (HOSPITAL_COMMUNITY): Payer: Self-pay | Admitting: Physician Assistant

## 2019-11-15 ENCOUNTER — Other Ambulatory Visit: Payer: Self-pay | Admitting: Physician Assistant

## 2019-11-15 DIAGNOSIS — M542 Cervicalgia: Secondary | ICD-10-CM

## 2019-11-21 ENCOUNTER — Other Ambulatory Visit: Payer: Self-pay

## 2019-11-21 ENCOUNTER — Ambulatory Visit
Admission: RE | Admit: 2019-11-21 | Discharge: 2019-11-21 | Disposition: A | Discharge status: Home / Self Care | Payer: Medicaid Other | Source: Ambulatory Visit | Attending: Physician Assistant | Admitting: Physician Assistant

## 2019-11-21 ENCOUNTER — Other Ambulatory Visit
Admission: RE | Admit: 2019-11-21 | Discharge: 2019-11-21 | Disposition: A | Discharge status: Home / Self Care | Payer: Medicaid Other | Source: Ambulatory Visit | Attending: Physician Assistant | Admitting: Physician Assistant

## 2019-11-21 DIAGNOSIS — M542 Cervicalgia: Secondary | ICD-10-CM | POA: Insufficient documentation

## 2019-11-21 HISTORY — DX: Essential (primary) hypertension: I10

## 2019-11-21 LAB — CREATININE, SERUM
Creatinine, Ser: 0.98 mg/dL (ref 0.44–1.00)
GFR calc Af Amer: >60 mL/min (ref >60)
GFR calc non Af Amer: >60 mL/min (ref >60)

## 2019-11-21 MED ORDER — IOHEXOL 300 MG/ML  SOLN
75.0000 mL | Freq: Once | INTRAMUSCULAR | Status: AC | PRN
  Administered 2019-11-21: 75 mL via INTRAVENOUS

## 2020-01-23 ENCOUNTER — Other Ambulatory Visit: Payer: Self-pay

## 2020-01-23 ENCOUNTER — Encounter: Payer: Self-pay | Admitting: Emergency Medicine

## 2020-01-23 ENCOUNTER — Emergency Department
Admission: EM | Admit: 2020-01-23 | Discharge: 2020-01-23 | Disposition: A | Discharge status: Home / Self Care | Payer: Medicaid Other | Attending: Emergency Medicine | Admitting: Emergency Medicine

## 2020-01-23 ENCOUNTER — Emergency Department: Payer: Medicaid Other

## 2020-01-23 DIAGNOSIS — R200 Anesthesia of skin: Secondary | ICD-10-CM | POA: Insufficient documentation

## 2020-01-23 DIAGNOSIS — M79602 Pain in left arm: Secondary | ICD-10-CM | POA: Insufficient documentation

## 2020-01-23 DIAGNOSIS — Z5321 Procedure and treatment not carried out due to patient leaving prior to being seen by health care provider: Secondary | ICD-10-CM | POA: Insufficient documentation

## 2020-01-23 LAB — BASIC METABOLIC PANEL
Anion gap: 16 — ABNORMAL HIGH (ref 5–15)
BUN: 17 mg/dL (ref 6–20)
CO2: 19 mmol/L — ABNORMAL LOW (ref 22–32)
Calcium: 8.7 mg/dL — ABNORMAL LOW (ref 8.9–10.3)
Chloride: 104 mmol/L (ref 98–111)
Creatinine, Ser: 0.88 mg/dL (ref 0.44–1.00)
GFR calc Af Amer: >60 mL/min (ref >60)
GFR calc non Af Amer: >60 mL/min (ref >60)
Glucose, Bld: 68 mg/dL — ABNORMAL LOW (ref 70–99)
Potassium: 4.1 mmol/L (ref 3.5–5.1)
Sodium: 139 mmol/L (ref 135–145)

## 2020-01-23 LAB — CBC
HCT: 40.2 % (ref 36.0–46.0)
Hemoglobin: 13.7 g/dL (ref 12.0–15.0)
MCH: 33.6 pg (ref 26.0–34.0)
MCHC: 34.1 g/dL (ref 30.0–36.0)
MCV: 98.5 fL (ref 80.0–100.0)
Platelets: 197 10*3/uL (ref 150–400)
RBC: 4.08 MIL/uL (ref 3.87–5.11)
RDW: 12.6 % (ref 11.5–15.5)
WBC: 8.1 10*3/uL (ref 4.0–10.5)
nRBC: 0 % (ref 0.0–0.2)

## 2020-01-23 LAB — TROPONIN I (HIGH SENSITIVITY): Troponin I (High Sensitivity): 11 ng/L (ref <18)

## 2020-01-23 NOTE — ED Triage Notes (Addendum)
Patient presents to the ED with chest pain that began last night.  Patient states she checked her bp and it was 150/87 and her heart rate was 125.  Patient states, "it just scared the crap out of me.  I don't feel right."  Patient states it feels like someone is "punching her" in the center of her chest and she has some numbness radiating down her left arm.  Patient states she has been out of her HCTZ x 3 weeks due to insurance problems and inability to see her PCP.

## 2020-03-14 ENCOUNTER — Emergency Department: Payer: Medicaid Other

## 2020-03-14 ENCOUNTER — Encounter: Payer: Self-pay | Admitting: Emergency Medicine

## 2020-03-14 ENCOUNTER — Emergency Department
Admission: EM | Admit: 2020-03-14 | Discharge: 2020-03-14 | Disposition: A | Discharge status: Home / Self Care | Payer: Medicaid Other | Attending: Emergency Medicine | Admitting: Emergency Medicine

## 2020-03-14 ENCOUNTER — Other Ambulatory Visit: Payer: Self-pay

## 2020-03-14 DIAGNOSIS — F17210 Nicotine dependence, cigarettes, uncomplicated: Secondary | ICD-10-CM | POA: Insufficient documentation

## 2020-03-14 DIAGNOSIS — R112 Nausea with vomiting, unspecified: Secondary | ICD-10-CM

## 2020-03-14 DIAGNOSIS — Z79890 Hormone replacement therapy (postmenopausal): Secondary | ICD-10-CM | POA: Insufficient documentation

## 2020-03-14 DIAGNOSIS — K226 Gastro-esophageal laceration-hemorrhage syndrome: Secondary | ICD-10-CM | POA: Insufficient documentation

## 2020-03-14 DIAGNOSIS — E876 Hypokalemia: Secondary | ICD-10-CM | POA: Insufficient documentation

## 2020-03-14 DIAGNOSIS — R1013 Epigastric pain: Secondary | ICD-10-CM | POA: Insufficient documentation

## 2020-03-14 DIAGNOSIS — R1012 Left upper quadrant pain: Secondary | ICD-10-CM | POA: Insufficient documentation

## 2020-03-14 DIAGNOSIS — I10 Essential (primary) hypertension: Secondary | ICD-10-CM | POA: Insufficient documentation

## 2020-03-14 DIAGNOSIS — E039 Hypothyroidism, unspecified: Secondary | ICD-10-CM | POA: Insufficient documentation

## 2020-03-14 DIAGNOSIS — K920 Hematemesis: Secondary | ICD-10-CM | POA: Insufficient documentation

## 2020-03-14 DIAGNOSIS — F1020 Alcohol dependence, uncomplicated: Secondary | ICD-10-CM | POA: Insufficient documentation

## 2020-03-14 DIAGNOSIS — E860 Dehydration: Secondary | ICD-10-CM | POA: Insufficient documentation

## 2020-03-14 LAB — COMPREHENSIVE METABOLIC PANEL
ALT: 63 U/L — ABNORMAL HIGH (ref 0–44)
AST: 93 U/L — ABNORMAL HIGH (ref 15–41)
Albumin: 4.4 g/dL (ref 3.5–5.0)
Alkaline Phosphatase: 72 U/L (ref 38–126)
Anion gap: 14 (ref 5–15)
BUN: 6 mg/dL (ref 6–20)
CO2: 27 mmol/L (ref 22–32)
Calcium: 9.6 mg/dL (ref 8.9–10.3)
Chloride: 92 mmol/L — ABNORMAL LOW (ref 98–111)
Creatinine, Ser: 1.02 mg/dL — ABNORMAL HIGH (ref 0.44–1.00)
GFR calc Af Amer: >60 mL/min (ref >60)
GFR calc non Af Amer: >60 mL/min (ref >60)
Glucose, Bld: 118 mg/dL — ABNORMAL HIGH (ref 70–99)
Potassium: 2.9 mmol/L — ABNORMAL LOW (ref 3.5–5.1)
Sodium: 133 mmol/L — ABNORMAL LOW (ref 135–145)
Total Bilirubin: 1 mg/dL (ref 0.3–1.2)
Total Protein: 7.1 g/dL (ref 6.5–8.1)

## 2020-03-14 LAB — TYPE AND SCREEN
ABO/RH(D): A POS
Antibody Screen: NEGATIVE

## 2020-03-14 LAB — CBC
HCT: 39.4 % (ref 36.0–46.0)
Hemoglobin: 14.3 g/dL (ref 12.0–15.0)
MCH: 33.3 pg (ref 26.0–34.0)
MCHC: 36.3 g/dL — ABNORMAL HIGH (ref 30.0–36.0)
MCV: 91.8 fL (ref 80.0–100.0)
Platelets: 131 10*3/uL — ABNORMAL LOW (ref 150–400)
RBC: 4.29 MIL/uL (ref 3.87–5.11)
RDW: 12.5 % (ref 11.5–15.5)
WBC: 5.5 10*3/uL (ref 4.0–10.5)
nRBC: 0 % (ref 0.0–0.2)

## 2020-03-14 LAB — TROPONIN I (HIGH SENSITIVITY): Troponin I (High Sensitivity): 7 ng/L (ref <18)

## 2020-03-14 LAB — LIPASE, BLOOD: Lipase: 54 U/L — ABNORMAL HIGH (ref 11–51)

## 2020-03-14 MED ORDER — MAGNESIUM SULFATE 2 GM/50ML IV SOLN
2.0000 g | Freq: Once | INTRAVENOUS | Status: AC
  Administered 2020-03-14: 2 g via INTRAVENOUS
  Filled 2020-03-14: qty 50

## 2020-03-14 MED ORDER — LACTATED RINGERS IV BOLUS
1000.0000 mL | Freq: Once | INTRAVENOUS | Status: AC
  Administered 2020-03-14: 1000 mL via INTRAVENOUS

## 2020-03-14 MED ORDER — ALUM & MAG HYDROXIDE-SIMETH 200-200-20 MG/5ML PO SUSP
30.0000 mL | Freq: Once | ORAL | Status: AC
  Administered 2020-03-14: 30 mL via ORAL
  Filled 2020-03-14: qty 30

## 2020-03-14 MED ORDER — ONDANSETRON HCL 4 MG/2ML IJ SOLN
4.0000 mg | Freq: Once | INTRAMUSCULAR | Status: AC
  Administered 2020-03-14: 4 mg via INTRAVENOUS
  Filled 2020-03-14: qty 2

## 2020-03-14 MED ORDER — POTASSIUM CHLORIDE 20 MEQ PO PACK
40.0000 meq | PACK | Freq: Once | ORAL | Status: AC
  Administered 2020-03-14: 40 meq via ORAL
  Filled 2020-03-14: qty 2

## 2020-03-14 MED ORDER — LIDOCAINE VISCOUS HCL 2 % MT SOLN
15.0000 mL | Freq: Once | OROMUCOSAL | Status: AC
  Administered 2020-03-14: 15 mL via ORAL
  Filled 2020-03-14: qty 15

## 2020-03-14 MED ORDER — ADULT MULTIVITAMIN W/MINERALS CH
1.0000 | ORAL_TABLET | Freq: Once | ORAL | Status: AC
  Administered 2020-03-14: 1 via ORAL
  Filled 2020-03-14: qty 1

## 2020-03-14 NOTE — ED Provider Notes (Signed)
[facility] Emergency Department Provider Note ____________________________________________   First MD Initiated Contact with Patient 03/14/20 1339     (approximate)  I have reviewed the triage vital signs and the nursing notes.  HISTORY  Chief Complaint Emesis and Hematemesis   HPI Jamie Johnson is a 54 y.o. femalewho presents to the ED for evaluation of emesis and abdominal pain.  Chart review indicates history of alcoholism. Patient reports an affinity for tequila.  She reports her last drink was 5 days ago.  Denies additional recreational drug use.  Denies suicidality.   Patient presents to the ED with 4-5 days of nausea, emesis and occasional hematemesis.  She reports vomiting "a few" times per day, typically nonbloody and nonbilious, though she does report developing pink/red streaking within her emesis over the past 2 days.  She reports persistent epigastric and LUQ abdominal pain over the past 4 days, 5/10 intensity, unremitting, nonradiating and not improved with home Maalox.  Patient denies any fevers, diarrhea, dysuria, syncope, substernal chest pain/pressure, shortness of breath, cough or trauma.  Past Medical History:  Diagnosis Date  . Arthritis   . Complication of anesthesia    has very slow heartrate-drops with any anesthesia to 28  . DJD (degenerative joint disease)   . Hypertension     Patient Active Problem List   Diagnosis Date Noted  . Postmenopausal 01/24/2015  . Allergic rhinitis 01/16/2015  . Anxiety 01/16/2015  . H/O alcohol abuse 01/16/2015  . H/O drug abuse (HCC) 01/16/2015  . Cannot sleep 01/16/2015  . Thyroid nodule 01/16/2015  . Compulsive tobacco user syndrome 01/16/2015  . Bradycardia, sinus 01/16/2015  . Low back pain 01/16/2015  . Hypothyroidism 12/01/2014    Past Surgical History:  Procedure Laterality Date  . ABDOMINAL HYSTERECTOMY     TAH  . APPENDECTOMY    . CHOLECYSTECTOMY    . FRACTURE SURGERY      right foot  . KNEE ARTHROSCOPY     left  . KNEE ARTHROSCOPY WITH LATERAL RELEASE  05/06/2012   Procedure: KNEE ARTHROSCOPY WITH LATERAL RELEASE;  Surgeon: Loreta Ave, MD;  Location: [facility];  Service: Orthopedics;  Laterality: Right;  RIGHT KNEE ARTHROSCOPY WITH LATERAL RELEASE ,Excision Plica, Chondroplasty   . SHOULDER ARTHROSCOPY     right  . SHOULDER ARTHROSCOPY     left  . TONSILLECTOMY    . WRIST ARTHROPLASTY     left    Prior to Admission medications   Medication Sig Start Date End Date Taking? Authorizing Provider  cyclobenzaprine (FLEXERIL) 10 MG tablet Take 1 tablet (10 mg total) by mouth at bedtime. 01/16/15   Lorie Phenix, MD  estradiol (ESTRACE) 1 MG tablet Take 1 tablet (1 mg total) by mouth daily. 02/05/15   Lorie Phenix, MD  levothyroxine (SYNTHROID, LEVOTHROID) 88 MCG tablet Take 1 tablet (88 mcg total) by mouth daily. 01/24/15   Lorie Phenix, MD  naproxen (NAPROSYN) 500 MG tablet Take 1 tablet (500 mg total) by mouth 2 (two) times daily with a meal. 01/16/15   Lorie Phenix, MD    Allergies Pseudoephedrine  Family History  Problem Relation Age of Onset  . Pulmonary fibrosis Mother   . COPD Mother   . Asthma Mother   . Stroke Father   . Heart disease Brother     Social History Social History   Tobacco Use  . Smoking status: Current Every Day Smoker    Packs/day: 0.25  . Smokeless tobacco: Never Used  Substance Use Topics  . Alcohol use: Yes    Comment: occ  . Drug use: No    Review of Systems  Constitutional: No fever/chills Eyes: No visual changes. ENT: No sore throat. Cardiovascular: Denies chest pain. Respiratory: Denies shortness of breath. Gastrointestinal: Positive for abdominal pain, nausea and vomiting.  No diarrhea.  No constipation. Genitourinary: Negative for dysuria. Musculoskeletal: Negative for back pain. Skin: Negative for rash. Neurological: Negative for headaches, focal weakness or  numbness.   ____________________________________________   PHYSICAL EXAM:  VITAL SIGNS: Vitals:   03/14/20 1342 03/14/20 1802  BP: 117/85 124/77  Pulse: 89 80  Resp: 17 16  Temp:  98.7 F (37.1 C)  SpO2: 99% 98%      Constitutional: Alert and oriented. Well appearing and in no acute distress. Eyes: Conjunctivae are normal. PERRL. EOMI. Head: Atraumatic. Nose: No congestion/rhinnorhea. Mouth/Throat: Mucous membranes are dry.  Oropharynx non-erythematous. Neck: No stridor. No cervical spine tenderness to palpation. Cardiovascular: Normal rate, regular rhythm. Grossly normal heart sounds.  Good peripheral circulation. Respiratory: Normal respiratory effort.  No retractions. Lungs CTAB. Gastrointestinal: Soft , nondistended. No abdominal bruits. No CVA tenderness. Minimal epigastric tenderness to palpation without peritoneal signs.  Abdomen is otherwise benign. Musculoskeletal: No lower extremity tenderness nor edema.  No joint effusions. No signs of acute trauma. Neurologic:  Normal speech and language. No gross focal neurologic deficits are appreciated. No gait instability noted. Skin:  Skin is warm, dry and intact. No rash noted. Psychiatric: Mood and affect are normal. Speech and behavior are normal.  ____________________________________________   LABS (all labs ordered are listed, but only abnormal results are displayed)  Labs Reviewed  COMPREHENSIVE METABOLIC PANEL - Abnormal; Notable for the following components:      Result Value   Sodium 133 (*)    Potassium 2.9 (*)    Chloride 92 (*)    Glucose, Bld 118 (*)    Creatinine, Ser 1.02 (*)    AST 93 (*)    ALT 63 (*)    All other components within normal limits  CBC - Abnormal; Notable for the following components:   MCHC 36.3 (*)    Platelets 131 (*)    All other components within normal limits  LIPASE, BLOOD - Abnormal; Notable for the following components:   Lipase 54 (*)    All other components within  normal limits  TYPE AND SCREEN  TROPONIN I (HIGH SENSITIVITY)   ____________________________________________  RADIOLOGY  ED MD interpretation: 2 view CXR without evidence of acute cardiopulmonary pathology.  Official radiology report(s): DG Chest 2 View  Result Date: 03/14/2020 CLINICAL DATA:  Persistent vomiting EXAM: CHEST - 2 VIEW COMPARISON:  01/23/2020 FINDINGS: The heart size and mediastinal contours are within normal limits. Both lungs are clear. The visualized skeletal structures are unremarkable. Remote cholecystectomy noted. IMPRESSION: No active cardiopulmonary disease. Electronically Signed   By: Judie Petit.  Shick M.D.   On: 03/14/2020 14:19    ____________________________________________   PROCEDURES and INTERVENTIONS  Procedure(s) performed (including Critical Care):  Procedures  Medications  lactated ringers bolus 1,000 mL (0 mLs Intravenous Stopped 03/14/20 1800)  lactated ringers bolus 1,000 mL (0 mLs Intravenous Stopped 03/14/20 1800)  ondansetron (ZOFRAN) injection 4 mg (4 mg Intravenous Given 03/14/20 1355)  potassium chloride (KLOR-CON) packet 40 mEq (40 mEq Oral Given 03/14/20 1526)  magnesium sulfate IVPB 2 g 50 mL (0 g Intravenous Stopped 03/14/20 1737)  multivitamin with minerals tablet 1 tablet (1 tablet Oral Given 03/14/20 1526)  alum &  mag hydroxide-simeth (MAALOX/MYLANTA) 200-200-20 MG/5ML suspension 30 mL (30 mLs Oral Given 03/14/20 1607)    And  lidocaine (XYLOCAINE) 2 % viscous mouth solution 15 mL (15 mLs Oral Given 03/14/20 1607)    ____________________________________________   MDM / ED COURSE  54 year old alcoholic presents to the ED with few days of emesis and epigastric pain, most consistent with dehydration and very early/mild alcohol-induced pancreatitis and amenable to outpatient management.  Patient tachycardic in triage, resolving after fluid resuscitation, and vitals otherwise normal on room air.  Examination is reassuring without evidence of  acute distress.  Her abdomen has some minimal epigastric tenderness, without peritoneal features, and otherwise benign.  Blood work demonstrates mild metabolic derangements, likely due to her persistent drinking.  Patient was fluid resuscitated and electrolytes were repleted.  Patient subsequently reporting improved symptoms and subsequently tolerating p.o. intake of solid food with resolution of her symptoms.  I see no evidence of acute derangements to warrant advanced imaging of her abdomen or further work-up/admission to the hospital.  I am concerned that her symptoms are due to her continued drinking.  I discussed and recommended alcohol cessation to the patient, and she expresses understanding and agreement with trying.  No suicidality or psychiatric emergencies.  Patient suitable for outpatient management and follow-up with PCP.  We discussed return precautions for the ED.  Patient medically stable for discharge home.  Clinical Course as of Mar 14 1806  Wed Mar 14, 2020  1523 Fresh blood being drawn for troponin and lipase.  Patient reports slightly improving symptoms with fluid resuscitation.   [DS]  1718 Reassessed.  Patient reports improving symptoms.  She reports not having any food and is requesting some solid food.  We talked about p.o. trial and likely outpatient management.  Answered questions.  I urged her cessation from alcohol.  We discussed that her symptoms likely will come back to her drinking and alcoholism.  She expresses understanding agreement.   [DS]    Clinical Course User Index [DS] Delton Prairie, MD     ____________________________________________   FINAL CLINICAL IMPRESSION(S) / ED DIAGNOSES  Final diagnoses:  Alcoholism (HCC)  Non-intractable vomiting with nausea, unspecified vomiting type  Hypokalemia  Mallory-Weiss tear     ED Discharge Orders    None       Nareh Matzke   Note:  This document was prepared using Dragon voice recognition software and  may include unintentional dictation errors.   Delton Prairie, MD 03/14/20 [phone]

## 2020-03-14 NOTE — ED Triage Notes (Signed)
Presents with a 5 day hx of vomiting and having dark tarry stools   Having some pain to  Left upper quad

## 2020-03-14 NOTE — ED Notes (Signed)
Patient is eating graham crackers and drinking fluids. Patient wants to wait 10 minutes to see if she is nauseous after eating before she is discharged.

## 2020-03-14 NOTE — ED Notes (Signed)
Pt ambulated to/from toilet without assistance. Pt reports x1 emesis while in the bathroom. Pt reports nausea generally increases with ambulation

## 2020-03-14 NOTE — Discharge Instructions (Addendum)
You were seen in the ED because of your vomiting and blood streaking to your vomiting.  The small amount of blood in your vomit is likely due to superficial tearing of the inside of your esophagus from your vomiting.  This will heal on its own.  If you are vomiting becomes worse with bright red blood, passage of blood clots, please return to the ED.  I would strongly recommend you cut back and stop drinking.  I believe most, if not all, of the symptoms that you are feeling today are due to your drinking and poor solid food intake.  If you develop any fevers, worsening abdominal pain, please return to the ED.

## 2020-03-14 NOTE — ED Notes (Signed)
Patient transported to X-ray 

## 2021-10-23 ENCOUNTER — Emergency Department (HOSPITAL_COMMUNITY)
Admission: EM | Admit: 2021-10-23 | Discharge: 2021-10-23 | Disposition: A | Discharge status: Home / Self Care | Payer: No Typology Code available for payment source | Attending: Emergency Medicine | Admitting: Emergency Medicine

## 2021-10-23 ENCOUNTER — Telehealth: Payer: Self-pay

## 2021-10-23 ENCOUNTER — Encounter (HOSPITAL_COMMUNITY): Payer: Self-pay

## 2021-10-23 ENCOUNTER — Emergency Department (HOSPITAL_COMMUNITY): Payer: No Typology Code available for payment source

## 2021-10-23 DIAGNOSIS — I10 Essential (primary) hypertension: Secondary | ICD-10-CM | POA: Insufficient documentation

## 2021-10-23 DIAGNOSIS — R221 Localized swelling, mass and lump, neck: Secondary | ICD-10-CM | POA: Insufficient documentation

## 2021-10-23 DIAGNOSIS — E876 Hypokalemia: Secondary | ICD-10-CM | POA: Insufficient documentation

## 2021-10-23 DIAGNOSIS — R0602 Shortness of breath: Secondary | ICD-10-CM | POA: Insufficient documentation

## 2021-10-23 DIAGNOSIS — R0789 Other chest pain: Secondary | ICD-10-CM | POA: Insufficient documentation

## 2021-10-23 DIAGNOSIS — R079 Chest pain, unspecified: Secondary | ICD-10-CM

## 2021-10-23 DIAGNOSIS — R001 Bradycardia, unspecified: Secondary | ICD-10-CM | POA: Insufficient documentation

## 2021-10-23 LAB — BASIC METABOLIC PANEL
Anion gap: 10 (ref 5–15)
BUN: 12 mg/dL (ref 6–20)
CO2: 22 mmol/L (ref 22–32)
Calcium: 9.2 mg/dL (ref 8.9–10.3)
Chloride: 107 mmol/L (ref 98–111)
Creatinine, Ser: 0.8 mg/dL (ref 0.44–1.00)
GFR, Estimated: >60 mL/min (ref >60)
Glucose, Bld: 121 mg/dL — ABNORMAL HIGH (ref 70–99)
Potassium: 3.3 mmol/L — ABNORMAL LOW (ref 3.5–5.1)
Sodium: 139 mmol/L (ref 135–145)

## 2021-10-23 LAB — CBC
HCT: 40.1 % (ref 36.0–46.0)
Hemoglobin: 14.6 g/dL (ref 12.0–15.0)
MCH: 34.4 pg — ABNORMAL HIGH (ref 26.0–34.0)
MCHC: 36.4 g/dL — ABNORMAL HIGH (ref 30.0–36.0)
MCV: 94.6 fL (ref 80.0–100.0)
Platelets: 156 10*3/uL (ref 150–400)
RBC: 4.24 MIL/uL (ref 3.87–5.11)
RDW: 12.3 % (ref 11.5–15.5)
WBC: 5.4 10*3/uL (ref 4.0–10.5)
nRBC: 0 % (ref 0.0–0.2)

## 2021-10-23 LAB — TROPONIN I (HIGH SENSITIVITY)
Troponin I (High Sensitivity): 5 ng/L (ref <18)
Troponin I (High Sensitivity): 5 ng/L (ref <18)

## 2021-10-23 MED ORDER — SODIUM CHLORIDE (PF) 0.9 % IJ SOLN
INTRAMUSCULAR | Status: AC
  Filled 2021-10-23: qty 50

## 2021-10-23 MED ORDER — POTASSIUM CHLORIDE CRYS ER 20 MEQ PO TBCR
40.0000 meq | EXTENDED_RELEASE_TABLET | Freq: Once | ORAL | Status: AC
  Administered 2021-10-23: 40 meq via ORAL
  Filled 2021-10-23: qty 2

## 2021-10-23 MED ORDER — IOHEXOL 300 MG/ML  SOLN
100.0000 mL | Freq: Once | INTRAMUSCULAR | Status: AC | PRN
  Administered 2021-10-23: 75 mL via INTRAVENOUS

## 2021-10-23 NOTE — Progress Notes (Signed)
Transition of Care [facility]) - Emergency Department Mini Assessment ? ? ?Patient Details  ?Name: Jamie Johnson ?MRN: [id number] ?Date of Birth: Jun 06, 1966 ? ?Transition of Care (TOC) CM/SW Contact:    ?Lavenia Atlas, RN ?Phone Number: ?10/23/2021, 12:26 PM ? ? ?Clinical Narrative: ?Patient presents to [facility] ED with c/o chest pain. RNCM received TOC consult for PCP needs.  ? ? ?ED Mini Assessment: ? This RNCM spoke with patient at bedside. Patient reports she recently moved from [place]. Patient has medical insurance with [facility] however unfamiliar with the doctors. Patient is requesting assistance with getting PCP that's connected with [facility].  ?Patient does not have a preference on PCP gender or time of appointment.  ? ?This RNCM will follow up.  ? ?  ?Patient Contact and Communications ?  ? Patient: [phone] ?  ?  ?Admission diagnosis:  High BP, chest pain, fast heart rate, HA ?Patient Active Problem List  ? Diagnosis Date Noted  ? Postmenopausal 01/24/2015  ? Allergic rhinitis 01/16/2015  ? Anxiety 01/16/2015  ? H/O alcohol abuse 01/16/2015  ? H/O drug abuse (HCC) 01/16/2015  ? Cannot sleep 01/16/2015  ? Thyroid nodule 01/16/2015  ? Compulsive tobacco user syndrome 01/16/2015  ? Bradycardia, sinus 01/16/2015  ? Low back pain 01/16/2015  ? Hypothyroidism 12/01/2014  ? ?PCP:  [facility] ?Pharmacy:   ?[id number] [place], [place] - [address] AT [facility] OF [address] ?[address] ?[place] [address] ?Phone: [phone] Fax: [phone] ? ?[facility] [place], [place] - [address] [place] ?[address] [place] ?[address] ?[place] [address] ?Phone: [phone] Fax: [phone] ?  ?

## 2021-10-23 NOTE — ED Provider Triage Note (Signed)
Emergency Medicine Provider Triage Evaluation Note ? ?Jamie Johnson , a 56 y.o. female  was evaluated in triage.  Pt complains of chest discomfort.  Pt reports her blood pressure was high his.   ? ?Review of Systems  ?Positive:  ?Negative: No fever  ? ?Physical Exam  ?BP (!) 154/105 (BP Location: Left Arm)   Pulse 84   Temp 97.8 ?F (36.6 ?C) (Oral)   Resp 18   SpO2 99%  ?Gen:   Awake, no distress   ?Resp:  Normal effort  ?MSK:   Moves extremities without difficulty  ?Other:   ? ?Medical Decision Making  ?Medically screening exam initiated at 9:03 AM.  Appropriate orders placed.  Jamie Johnson was informed that the remainder of the evaluation will be completed by another provider, this initial triage assessment does not replace that evaluation, and the importance of remaining in the ED until their evaluation is complete. ? ? ?  ?Fransico Meadow, [place] ?10/23/21 [id number] ? ?

## 2021-10-23 NOTE — Telephone Encounter (Signed)
Thi RNCM called patient to advise of her PCP appointment scheduled 10/28/21 at 3:30p (patient needs to arrive at 3:15p) Dr. Jackie Plum with [facility] ph# [phone] [place] [address] [phone].  ?RNCM left voicemail message.  ? ?No additional TOC needs. ?

## 2021-10-23 NOTE — ED Provider Notes (Signed)
?[facility]-EMERGENCY DEPT ?Provider Note ? ? ?CSN: [id number] ?Arrival date & time: 10/23/21  0751 ? ?  ? ?History ? ?Chief Complaint  ?Patient presents with  ? Chest Pain  ? Neck swelling  ? ? ?Jamie Johnson is a 56 y.o. female. ? ?Patient presenting with chest discomfort.  Reports that her blood pressures have been high over the last week with systolics up in the 180s.  She reports that she was having intermittent episodes of shortness of breath along with this chest discomfort but it got worse over the last 24 hours and so she felt like she needed to be seen.  Reports her only cardiac history is that when she was younger she was told that her heart sometimes skipped beats but no other heart history.  Reports family history of multiple family members with pacemakers and a grandfather who had a CABG when he was 4857.  She also reports that she has been having right sided neck swelling over the past 3 months.  Denies any other symptoms such as cough, congestion, runny nose, fever. ? ? ?  ? ?Home Medications ?Prior to Admission medications   ?Medication Sig Start Date End Date Taking? Authorizing Provider  ?cyclobenzaprine (FLEXERIL) 10 MG tablet Take 1 tablet (10 mg total) by mouth at bedtime. 01/16/15   Lorie PhenixMaloney, Nancy, MD  ?estradiol (ESTRACE) 1 MG tablet Take 1 tablet (1 mg total) by mouth daily. 02/05/15   Lorie PhenixMaloney, Nancy, MD  ?levothyroxine (SYNTHROID, LEVOTHROID) 88 MCG tablet Take 1 tablet (88 mcg total) by mouth daily. 01/24/15   Lorie PhenixMaloney, Nancy, MD  ?naproxen (NAPROSYN) 500 MG tablet Take 1 tablet (500 mg total) by mouth 2 (two) times daily with a meal. 01/16/15   Lorie PhenixMaloney, Nancy, MD  ?   ? ?Allergies    ?Pseudoephedrine   ? ?Review of Systems   ?Review of Systems  ?HENT:  Negative for congestion.   ?Eyes:  Negative for photophobia.  ?Respiratory:  Positive for cough (Chronic cough from smoking).   ?Cardiovascular:  Positive for chest pain and palpitations.  ?Genitourinary:  Negative for difficulty  urinating.  ?Neurological:  Positive for headaches. Negative for dizziness.  ?All other systems reviewed and are negative. ? ?Physical Exam ?Updated Vital Signs ?BP (!) 159/95   Pulse (!) 42   Temp 97.8 ?F (36.6 ?C) (Oral)   Resp 17   SpO2 98%  ?Physical Exam ?Constitutional:   ?   General: She is not in acute distress. ?   Appearance: She is well-developed.  ?HENT:  ?   Head: Normocephalic and atraumatic.  ?Eyes:  ?   Extraocular Movements: Extraocular movements intact.  ?   Pupils: Pupils are equal, round, and reactive to light.  ?Neck:  ?   Thyroid: No thyromegaly.  ?   Vascular: No hepatojugular reflux.  ?Cardiovascular:  ?   Rate and Rhythm: Regular rhythm. Bradycardia present.  ?   Heart sounds: Normal heart sounds.  ?Pulmonary:  ?   Effort: Pulmonary effort is normal. No tachypnea or respiratory distress.  ?   Breath sounds: Normal breath sounds.  ?Abdominal:  ?   General: Bowel sounds are normal.  ?   Palpations: Abdomen is soft.  ?Musculoskeletal:     ?   General: Normal range of motion.  ?   Cervical back: Normal range of motion.  ?Lymphadenopathy:  ?   Cervical: No cervical adenopathy.  ?Skin: ?   General: Skin is warm.  ?   Capillary Refill: Capillary  refill takes less than 2 seconds.  ?Neurological:  ?   General: No focal deficit present.  ?   Mental Status: She is alert.  ? ? ?ED Results / Procedures / Treatments   ?Labs ?(all labs ordered are listed, but only abnormal results are displayed) ?Labs Reviewed  ?BASIC METABOLIC PANEL - Abnormal; Notable for the following components:  ?    Result Value  ? Potassium 3.3 (*)   ? Glucose, Bld 121 (*)   ? All other components within normal limits  ?CBC - Abnormal; Notable for the following components:  ? MCH 34.4 (*)   ? MCHC 36.4 (*)   ? All other components within normal limits  ?TSH  ?TROPONIN I (HIGH SENSITIVITY)  ?TROPONIN I (HIGH SENSITIVITY)  ? ? ?EKG ?EKG Interpretation ? ?Date/Time:  Wednesday Oct 23 2021 08:05:59 EDT ?Ventricular Rate:  81 ?PR  Interval:  157 ?QRS Duration: 93 ?QT Interval:  390 ?QTC Calculation: 453 ?R Axis:   87 ?Text Interpretation: Sinus rhythm similar to prior no stemi Confirmed by Tanda Rockers ([id number]) on 10/23/2021 11:15:34 AM ? ?Radiology ?DG Chest 2 View ? ?Result Date: 10/23/2021 ?CLINICAL DATA:  Chest pain. Tech note: New onset lt sided chest pain, rt side of neck swelling since last night, smoker, EXAM: CHEST - 2 VIEW COMPARISON:  Chest x-ray 03/14/2020. FINDINGS: No consolidation. No visible pleural effusions or pneumothorax. Cardiomediastinal silhouette is within normal limits and unchanged. Prior right rib fracture with bony callus, new since 2021.Cholecystectomy clips. IMPRESSION: 1. Somewhat nodular opacity in the lateral right midlung. This could potentially be bony or artifactual in etiology, but recommend CT of the chest to exclude pulmonary nodule/malignancy, particularly given reported smoking history. 2. Otherwise, no evidence of acute cardiopulmonary disease. 3. Prior right rib fracture, new since 2021. Electronically Signed   By: Feliberto Harts M.D.   On: 10/23/2021 08:26  ? ?CT Chest W Contrast ? ?Result Date: 10/23/2021 ?CLINICAL DATA:  Abnormal chest radiograph, possible lung nodule. EXAM: CT CHEST WITH CONTRAST TECHNIQUE: Multidetector CT imaging of the chest was performed during intravenous contrast administration. RADIATION DOSE REDUCTION: This exam was performed according to the departmental dose-optimization program which includes automated exposure control, adjustment of the mA and/or kV according to patient size and/or use of iterative reconstruction technique. CONTRAST:  17mL OMNIPAQUE IOHEXOL 300 MG/ML  SOLN COMPARISON:  Chest radiograph 10/23/2021. FINDINGS: Cardiovascular: Atherosclerotic calcification of the aorta, aortic valve and coronary arteries. Heart size normal. No pericardial effusion. Mediastinum/Nodes: No pathologically enlarged mediastinal, hilar or axillary lymph nodes. Esophagus is  unremarkable. Lungs/Pleura: Mild centrilobular and paraseptal emphysema. Mild smoking related respiratory bronchiolitis. Tiny subpleural lymph node along the medial right major fissure (5/48). No pleural fluid. Airway is unremarkable. Upper Abdomen: Visualized portions of the liver, adrenal glands, kidneys, spleen, pancreas, stomach and bowel are grossly unremarkable. Cholecystectomy. Musculoskeletal: No worrisome lytic or sclerotic lesions. Old fractures of the posterolateral right eighth and ninth ribs. IMPRESSION: 1. An old rib fracture accounts for the questioned abnormality on recent chest radiograph. No suspicious pulmonary nodules. 2. Aortic atherosclerosis (ICD10-I70.0). Coronary artery calcification. 3.  Emphysema (ICD10-J43.9). Electronically Signed   By: Leanna Battles M.D.   On: 10/23/2021 11:54   ? ?Procedures ?Procedures  ? ? ?Medications Ordered in ED ?Medications  ?potassium chloride SA (KLOR-CON M) CR tablet 40 mEq (40 mEq Oral Given 10/23/21 1115)  ?sodium chloride (PF) 0.9 % injection (  Given by Other 10/23/21 1138)  ?iohexol (OMNIPAQUE) 300 MG/ML solution 100 mL (75 mLs  Intravenous Contrast Given 10/23/21 1139)  ? ? ?ED Course/ Medical Decision Making/ A&P ?Clinical Course as of 10/23/21 1314  ?Wed Oct 23, 2021  ?1114 MCV: 94.6 [SG]  ?  ?Clinical Course User Index ?[SG] Tanda Rockers A, DO  ? ?                        ?Medical Decision Making ?Patient presents with chest discomfort.  Differential includes chest wall pain, ACS, PE, GERD.  Vital signs showing mildly hypertensive and bradycardic with normal O2 sat..  EKG showing sinus rhythm.  Cardiac monitor showing occasional skipped beats but otherwise normal sinus rhythm.  Troponins were flat at 5> 5.  CMP showing slight hypokalemia but otherwise within normal limits.  CBC within normal limits repleted patient's potassium.  Chest x-ray showing somewhat nodular opacity on the right midlung and suggested CT.  CT showing an old rib fracture accounting  for the questionable abnormality on the chest radiograph with no suspicious pulmonary nodules.  Etiology for chest pain unclear.  TSH pending at the time of discharge.  Patient recently moved to the area and does not

## 2021-10-23 NOTE — ED Triage Notes (Signed)
Pt presents with c/o chest pain that started early this morning as well as right side neck swelling. Pt reports she has a hx of hypertension and her blood pressure has been elevated recently as well. Pt does report a slight headache today. ?

## 2021-10-23 NOTE — Discharge Instructions (Signed)
It is a pleasure taking care of you today.  I am sorry you had these episodes of chest pain.  Your heart appears fine on evaluation today.  We do notice that you are occasionally skipping beats which seems to be like a chronic issue.  There is no need for further intervention at this time but we do recommend following up with cardiology.  We have placed a referral for this.  We also placed a referral for our social worker to help you find a primary care provider in the area.  We collected lab work which showed a slightly low potassium and I have attached information on potassium.  We also checked your thyroid hormone but those results have not come back yet.  Please activate your MyChart and if there are major abnormalities please be evaluated.  I hope you have a wonderful day! ? ? ?

## 2021-10-30 NOTE — Progress Notes (Signed)
Cardiology Office Note:    Date:  11/01/2021   ID:  Jamie Johnson, DOB Feb 17, 1966, MRN [id number]  PCP:  [facility] Providers Cardiologist:  None     Referring MD: Sloan Leiter, DO   No chief complaint on file. Atypical cp  History of Present Illness:    Jamie Johnson is a 56 y.o. female with a hx of arthritis, HTN, hypothyroid, ED visit for chest pain, ACS ruled out. She said she had chest pressure it lasted from 2AM to later in the AM. It woke her up. She felt weak. She had a HA and nausea. A referral to cardiology for atypical cp. EKG was normal. She has no cardiac hx. She feels some skipped heart beats. No syncope.  No exertional CP or SOB.  No persistent LH or dizziness.   Past Medical History:  Diagnosis Date   Arthritis    Complication of anesthesia    has very slow heartrate-drops with any anesthesia to 28   DJD (degenerative joint disease)    Hypertension     Past Surgical History:  Procedure Laterality Date   ABDOMINAL HYSTERECTOMY     TAH   APPENDECTOMY     CHOLECYSTECTOMY     FRACTURE SURGERY     right foot   KNEE ARTHROSCOPY     left   KNEE ARTHROSCOPY WITH LATERAL RELEASE  05/06/2012   Procedure: KNEE ARTHROSCOPY WITH LATERAL RELEASE;  Surgeon: Loreta Ave, MD;  Location: [facility];  Service: Orthopedics;  Laterality: Right;  RIGHT KNEE ARTHROSCOPY WITH LATERAL RELEASE ,Excision Plica, Chondroplasty    SHOULDER ARTHROSCOPY     right   SHOULDER ARTHROSCOPY     left   TONSILLECTOMY     WRIST ARTHROPLASTY     left    Current Medications: Current Outpatient Medications on File Prior to Visit  Medication Sig Dispense Refill   levothyroxine (SYNTHROID, LEVOTHROID) 88 MCG tablet Take 1 tablet (88 mcg total) by mouth daily. 90 tablet 3   lisinopril (ZESTRIL) 10 MG tablet 1 tab(s) orally once a day for 90 day(s)     No current facility-administered medications on file prior to visit.    Allergies:    Pseudoephedrine   Social History   Socioeconomic History   Marital status: Single    Spouse name: Not on file   Number of children: Not on file   Years of education: Not on file   Highest education level: Not on file  Occupational History   Not on file  Tobacco Use   Smoking status: Every Day    Packs/day: 0.25    Types: Cigarettes   Smokeless tobacco: Never  Substance and Sexual Activity   Alcohol use: Yes    Comment: occ   Drug use: No   Sexual activity: Not on file  Other Topics Concern   Not on file  Social History Narrative   Not on file   Social Determinants of Health   Financial Resource Strain: Not on file  Food Insecurity: Not on file  Transportation Needs: Not on file  Physical Activity: Not on file  Stress: Not on file  Social Connections: Not on file     Family History: The patient's family history includes Asthma in her mother; COPD in her mother; Heart disease in her brother; Pulmonary fibrosis in her mother; Stroke in her father. Mother had hx of PCI in her 59s.  ROS:   Please see the  history of present illness.     All other systems reviewed and are negative.  EKGs/Labs/Other Studies Reviewed:    The following studies were reviewed today:   Recent Labs: 10/23/2021: BUN 12; Creatinine, Ser 0.80; Hemoglobin 14.6; Platelets 156; Potassium 3.3; Sodium 139   Recent Lipid Panel No results found for: CHOL, TRIG, HDL, CHOLHDL, VLDL, LDLCALC, LDLDIRECT   Risk Assessment/Calculations:           Physical Exam:    VS:    Vitals:   10/31/21 1030  BP: (!) 134/98  Pulse: 72  SpO2: 98%     Wt Readings from Last 3 Encounters:  10/31/21 164 lb 12.8 oz (74.8 kg)  03/14/20 165 lb 5.5 oz (75 kg)  01/23/20 165 lb (74.8 kg)     GEN:  Well nourished, well developed in no acute distress HEENT: Normal NECK: No JVD; No carotid bruits LYMPHATICS: No lymphadenopathy CARDIAC: RRR, no murmurs, rubs, gallops RESPIRATORY:  Clear to auscultation  without rales, wheezing or rhonchi  ABDOMEN: Soft, non-tender, non-distended MUSCULOSKELETAL:  No edema; No deformity  SKIN: Warm and dry NEUROLOGIC:  Alert and oriented x 3 PSYCHIATRIC:  Normal affect   ASSESSMENT:    Chest pain: non-cardiac atypical CP. Recommend to continue with lifestyle modification and CVD risk mitigation (yearly A1c, lipid monitoring and smoking cessation). We discussed signs of coronary disease and to discuss with her provider if this were to arise and can plan for an ischemic eval. Otherwise, no further work up is recommended at this time. PLAN:    In order of problems listed above:  No further work up       Medication Adjustments/Labs and Tests Ordered: Current medicines are reviewed at length with the patient today.  Concerns regarding medicines are outlined above.  No orders of the defined types were placed in this encounter.  No orders of the defined types were placed in this encounter.   Patient Instructions  Medication Instructions:  Your physician recommends that you continue on your current medications as directed. Please refer to the Current Medication list given to you today.  Follow-Up: At [facility], you and your health needs are our priority.  As part of our continuing mission to provide you with exceptional heart care, we have created designated Provider Care Teams.  These Care Teams include your primary Cardiologist (physician) and Advanced Practice Providers (APPs -  Physician Assistants and Nurse Practitioners) who all work together to provide you with the care you need, when you need it.  We recommend signing up for the patient portal called "MyChart".  Sign up information is provided on this After Visit Summary.  MyChart is used to connect with patients for Virtual Visits (Telemedicine).  Patients are able to view lab/test results, encounter notes, upcoming appointments, etc.  Non-urgent messages can be sent to your provider as well.    To learn more about what you can do with MyChart, go to [email or web address].    Your next appointment:   AS NEEDED with Dr. Wyline Mood   Important Information About Sugar         Signed, Maisie Fus, MD  11/01/2021 8:38 AM    [facility]

## 2021-10-31 ENCOUNTER — Encounter: Payer: Self-pay | Admitting: Internal Medicine

## 2021-10-31 ENCOUNTER — Ambulatory Visit: Payer: No Typology Code available for payment source | Admitting: Internal Medicine

## 2021-10-31 VITALS — BP 134/98 | HR 72 | Ht 67.0 in | Wt 164.8 lb

## 2021-10-31 DIAGNOSIS — R079 Chest pain, unspecified: Secondary | ICD-10-CM | POA: Diagnosis not present

## 2021-10-31 NOTE — Patient Instructions (Signed)
Medication Instructions:  Your physician recommends that you continue on your current medications as directed. Please refer to the Current Medication list given to you today.  Follow-Up: At [facility], you and your health needs are our priority.  As part of our continuing mission to provide you with exceptional heart care, we have created designated Provider Care Teams.  These Care Teams include your primary Cardiologist (physician) and Advanced Practice Providers (APPs -  Physician Assistants and Nurse Practitioners) who all work together to provide you with the care you need, when you need it.  We recommend signing up for the patient portal called "MyChart".  Sign up information is provided on this After Visit Summary.  MyChart is used to connect with patients for Virtual Visits (Telemedicine).  Patients are able to view lab/test results, encounter notes, upcoming appointments, etc.  Non-urgent messages can be sent to your provider as well.   To learn more about what you can do with MyChart, go to [email or web address].    Your next appointment:   AS NEEDED with Dr. Branch   Important Information About Sugar     ]\  

## 2021-12-10 DIAGNOSIS — I10 Essential (primary) hypertension: Secondary | ICD-10-CM | POA: Insufficient documentation

## 2022-04-28 DIAGNOSIS — N951 Menopausal and female climacteric states: Secondary | ICD-10-CM | POA: Insufficient documentation

## 2022-04-28 DIAGNOSIS — T464X5A Adverse effect of angiotensin-converting-enzyme inhibitors, initial encounter: Secondary | ICD-10-CM | POA: Insufficient documentation

## 2022-08-31 IMAGING — CT CT CHEST W/ CM
2 of 3 series · 15 of 36 positions shown, 18 images · IV contrast (agent unspecified)
Comparison: Chest radiograph 10/23/2021.

CLINICAL DATA: Abnormal chest radiograph, possible lung nodule.

EXAM:
CT CHEST WITH CONTRAST
TECHNIQUE: Multidetector CT imaging of the chest was performed during
intravenous contrast administration.

[Series 2: axial st · axial · 0.77mm/px · z∈[+1462,+1738]mm · 12 of 164 slices shown, 15 images]
[im 13/164  mediastinal]
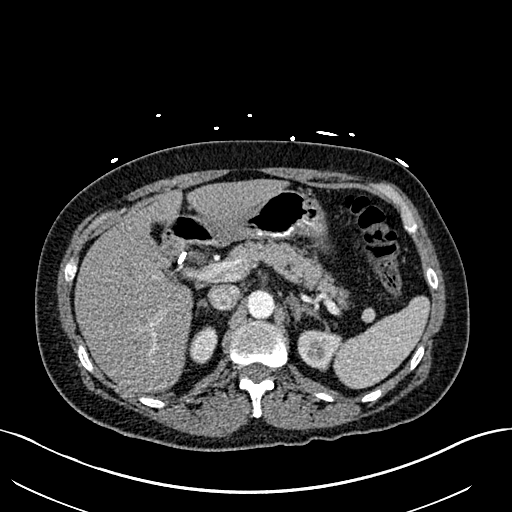
[im 13/164  lung]
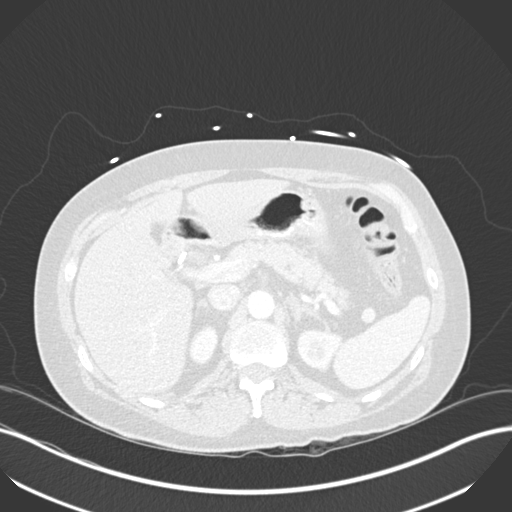
[im 25/164  lung]
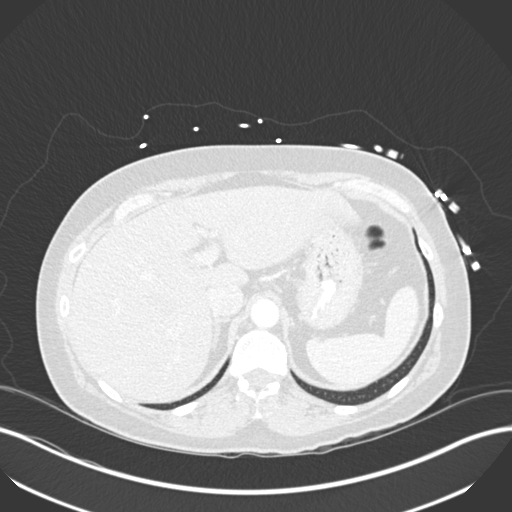
[im 37/164  lung]
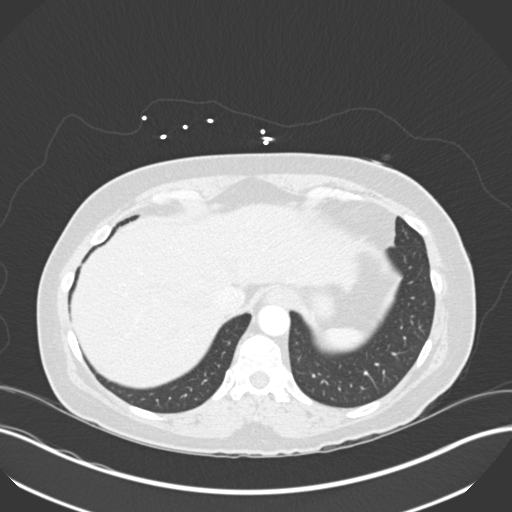
[im 49/164  lung]
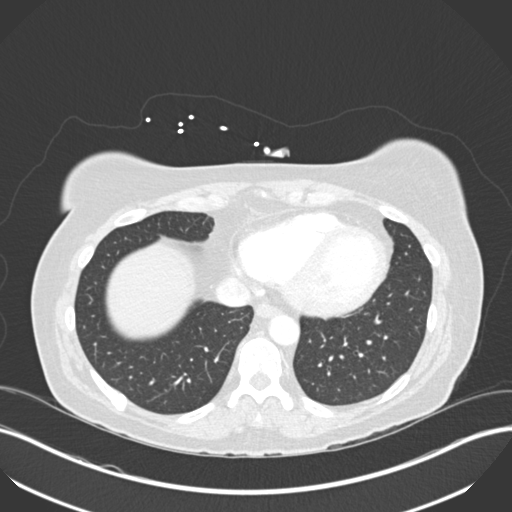
[im 61/164  mediastinal]
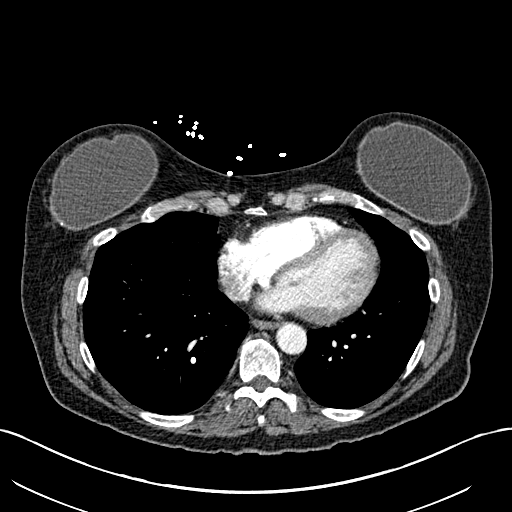
[im 61/164  lung]
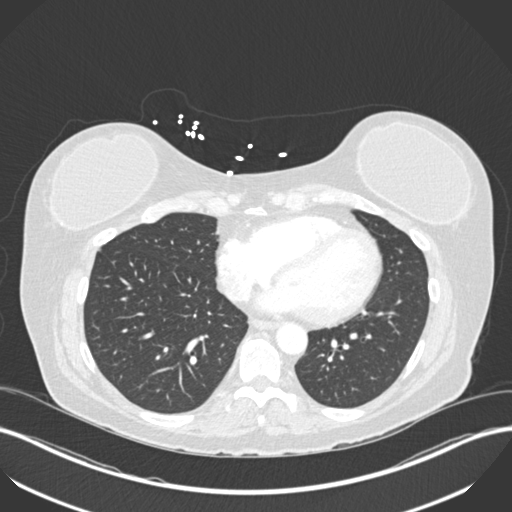
[im 73/164  lung]
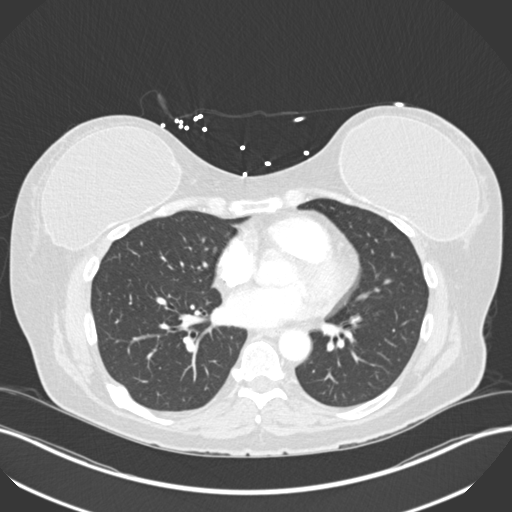
[im 91/164  lung]
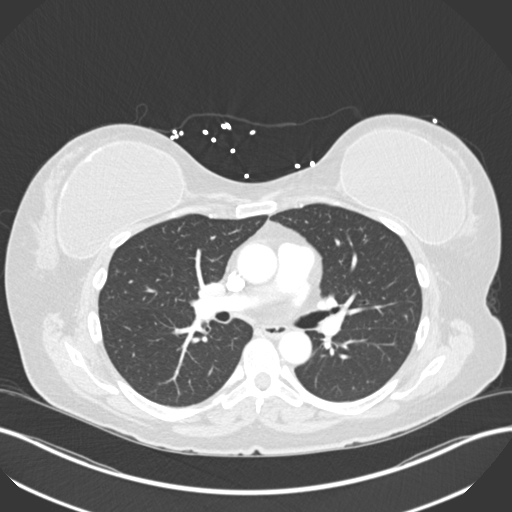
[im 103/164  lung]
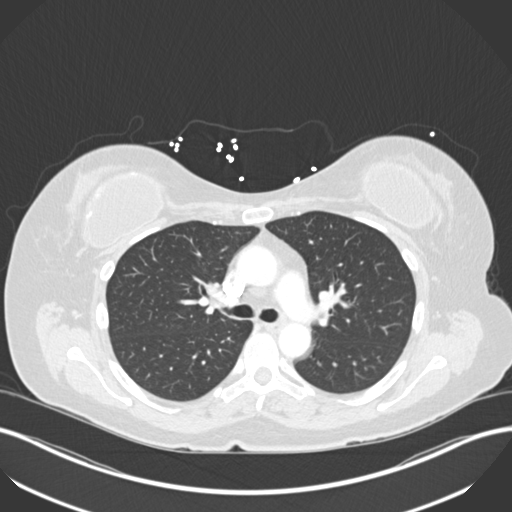
[im 115/164  mediastinal]
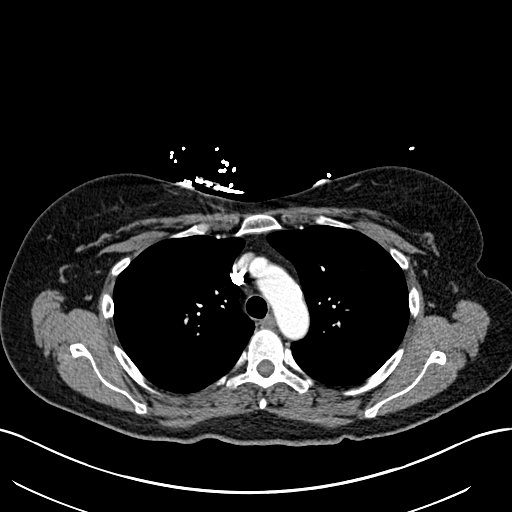
[im 115/164  lung]
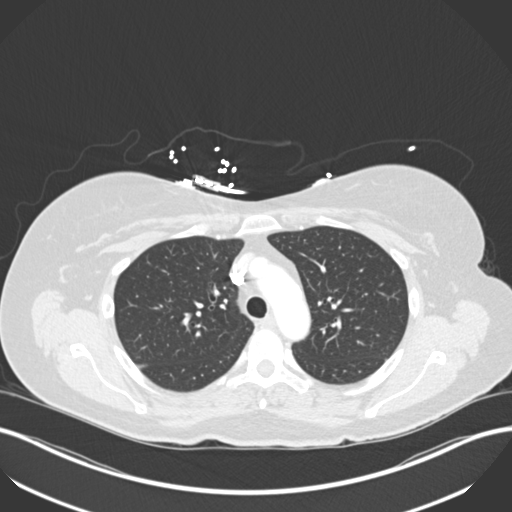
[im 127/164  lung]
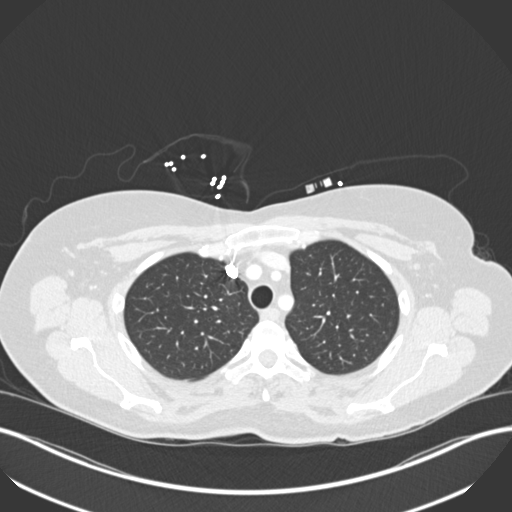
[im 139/164  lung]
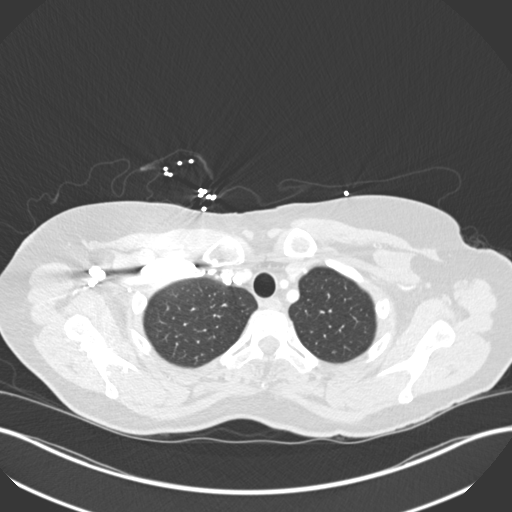
[im 151/164  lung]
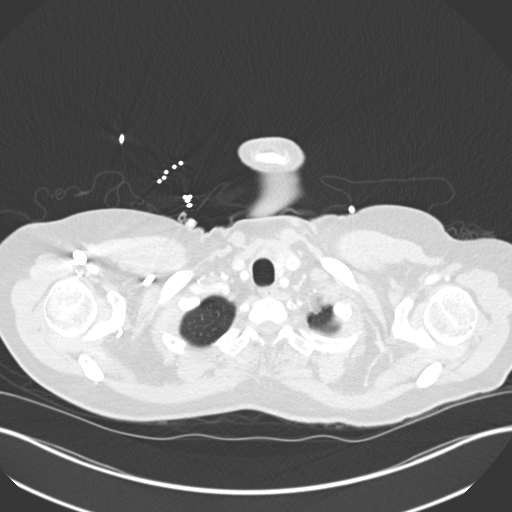

[Series 7: coronal · coronal · 0.69mm/px · 3 of 136 slices shown]
[im 28/136  lung]
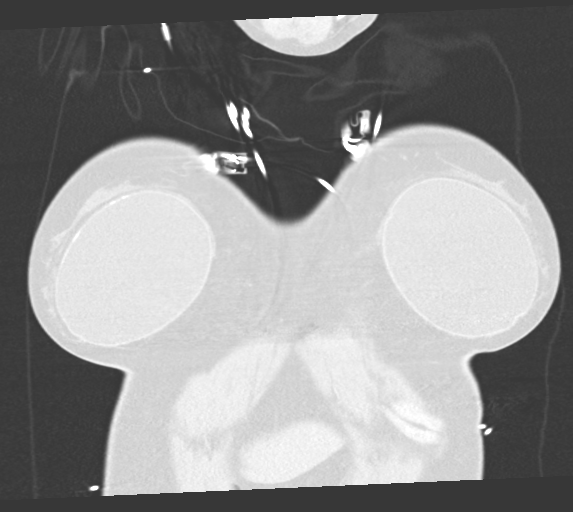
[im 55/136  lung]
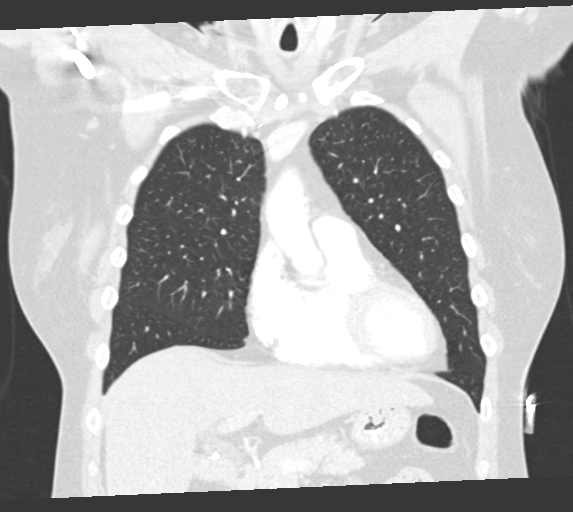
[im 82/136  lung]
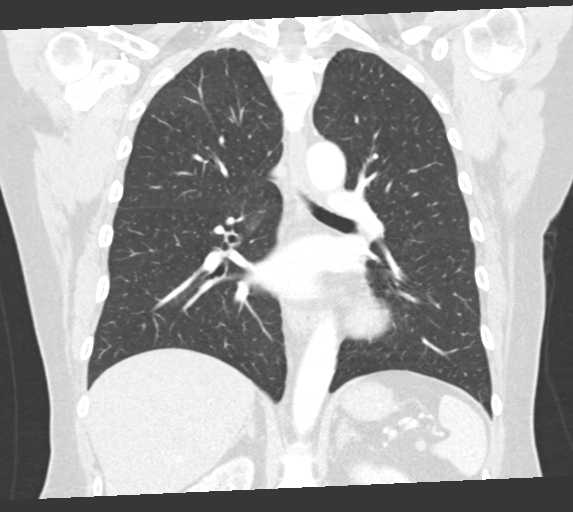

[15 of 36 positions shown; findings below may reference images not displayed]

RADIATION DOSE REDUCTION: This exam was performed according to the
departmental dose-optimization program which includes automated
exposure control, adjustment of the mA and/or kV according to
patient size and/or use of iterative reconstruction technique.

CONTRAST:  75mL OMNIPAQUE IOHEXOL 300 MG/ML  SOLN
FINDINGS: Cardiovascular: Atherosclerotic calcification of the aorta, aortic
valve and coronary arteries. Heart size normal. No pericardial
effusion.

Mediastinum/Nodes: No pathologically enlarged mediastinal, hilar or
axillary lymph nodes. Esophagus is unremarkable.

Lungs/Pleura: Mild centrilobular and paraseptal emphysema. Mild
smoking related respiratory bronchiolitis. Tiny subpleural lymph
node along the medial right major fissure (5/48). No pleural fluid.
Airway is unremarkable.

Upper Abdomen: Visualized portions of the liver, adrenal glands,
kidneys, spleen, pancreas, stomach and bowel are grossly
unremarkable. Cholecystectomy.

Musculoskeletal: No worrisome lytic or sclerotic lesions. Old
fractures of the posterolateral right eighth and ninth ribs.
IMPRESSION: 1. An old rib fracture accounts for the questioned abnormality on
recent chest radiograph. No suspicious pulmonary nodules.
2. Aortic atherosclerosis ([id number]). Coronary artery
calcification.
3.  Emphysema ([id number].2).

## 2022-08-31 IMAGING — CR DG CHEST 2V
2 series · 2 of 2 positions shown · non-contrast
Comparison: Chest x-ray 03/14/2020.

CLINICAL DATA: Chest pain.

Tech note: New onset lt sided chest pain, rt side of neck swelling
since last night, smoker,
EXAM:
CHEST - 2 VIEW

[w chest pa]
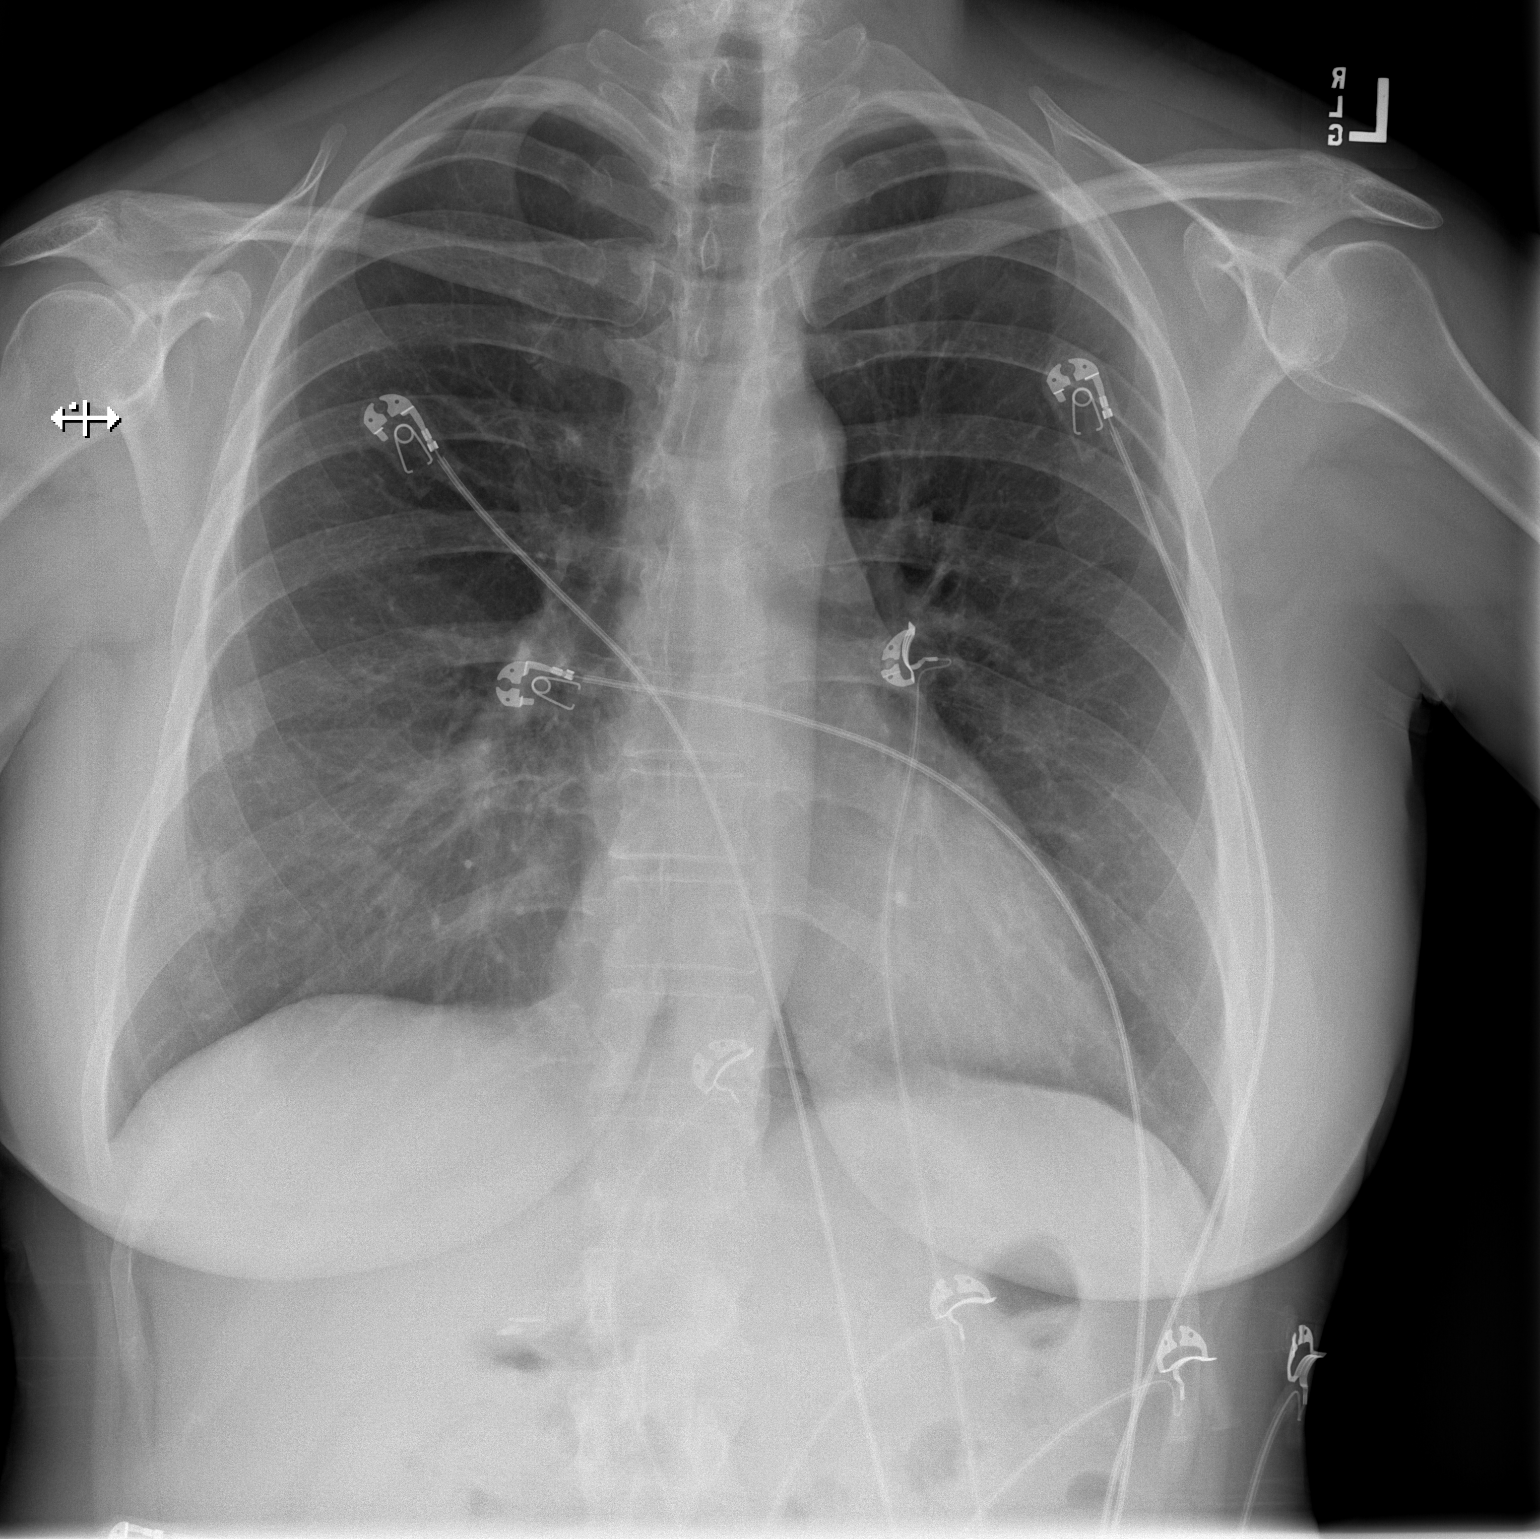

[w chest lat]
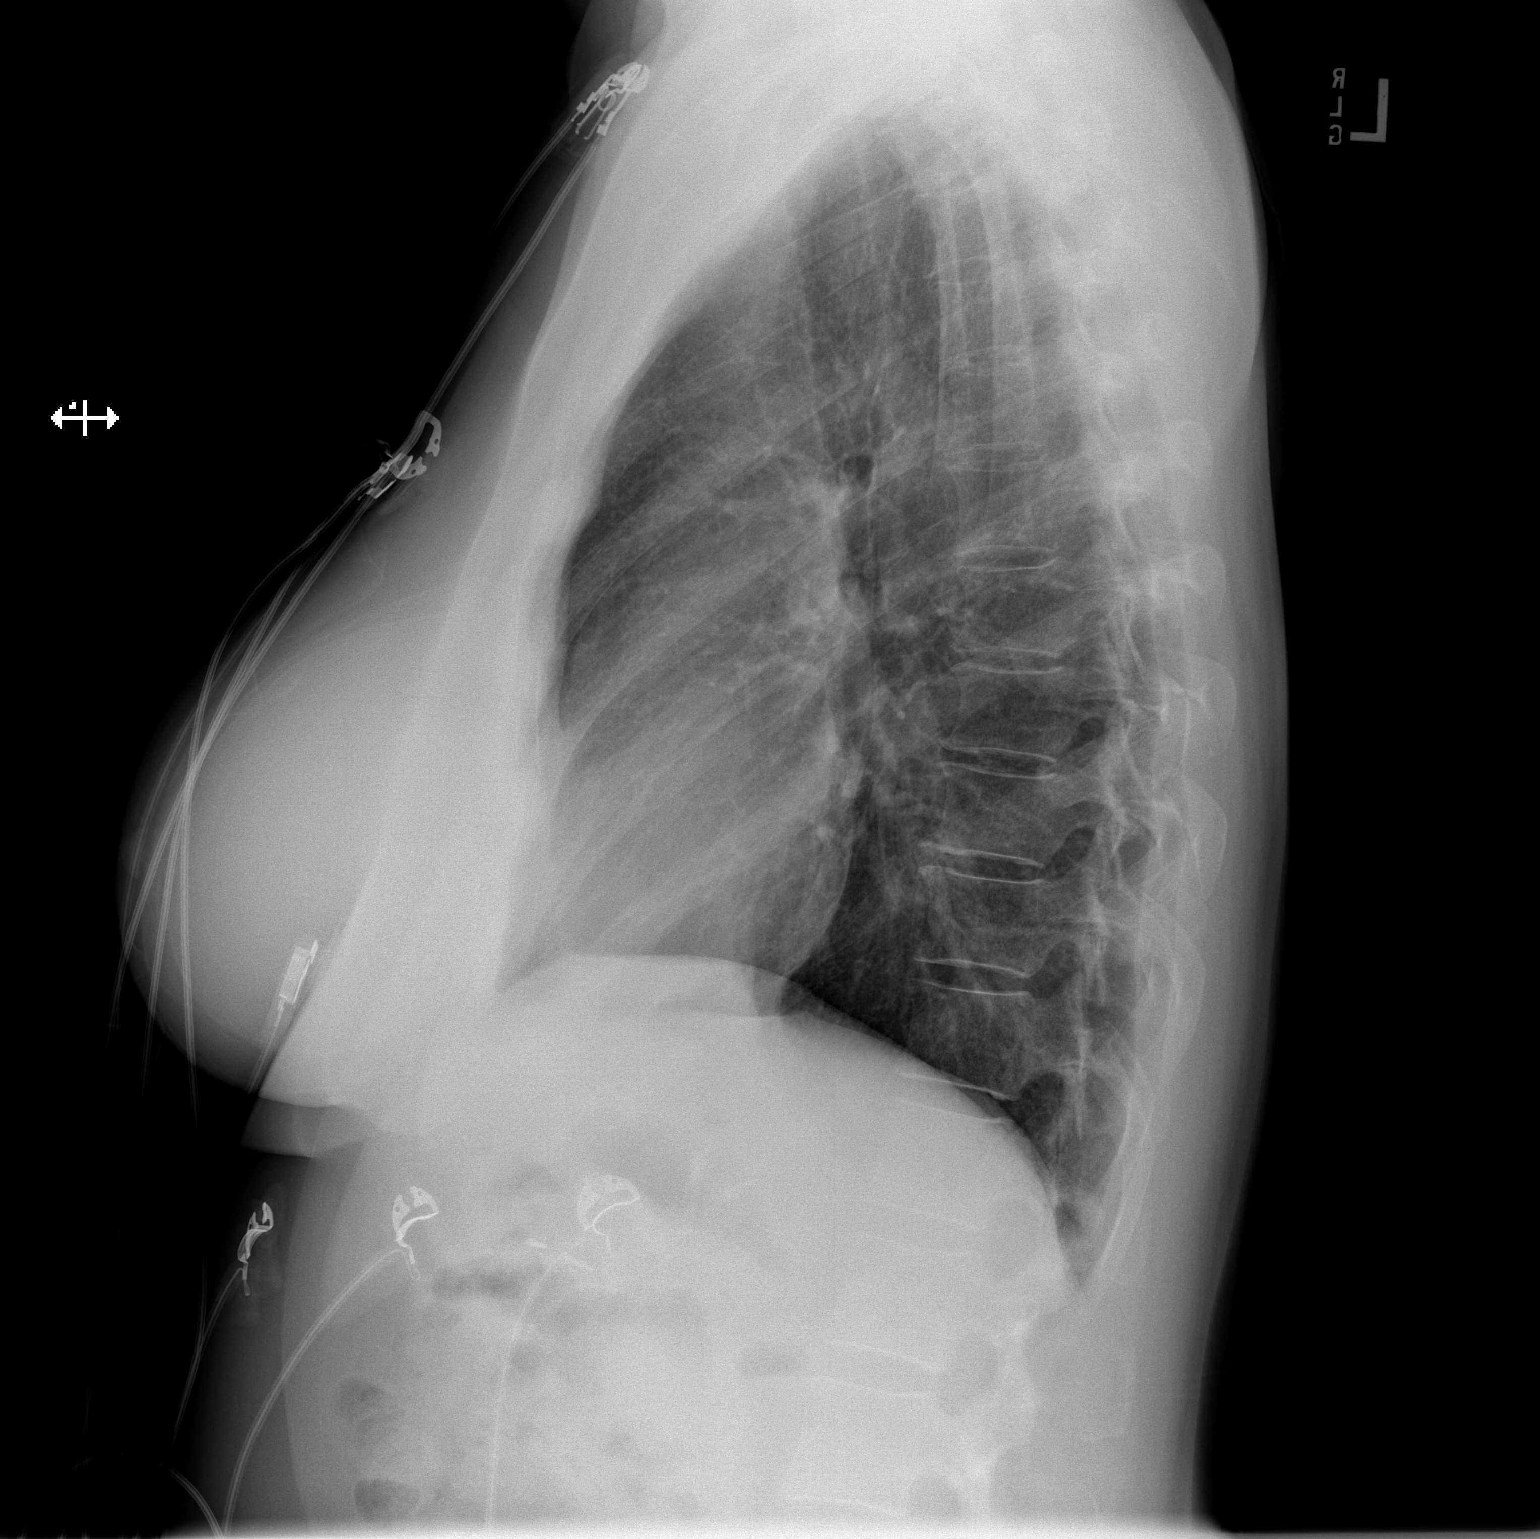

[2 of 2 positions shown; findings below may reference images not displayed]

FINDINGS: No consolidation. No visible pleural effusions or pneumothorax.
Cardiomediastinal silhouette is within normal limits and unchanged.
Prior right rib fracture with bony callus, new since
6669.Cholecystectomy clips.
IMPRESSION: 1. Somewhat nodular opacity in the lateral right midlung. This could
potentially be bony or artifactual in etiology, but recommend CT of
the chest to exclude pulmonary nodule/malignancy, particularly given
reported smoking history.
2. Otherwise, no evidence of acute cardiopulmonary disease.
3. Prior right rib fracture, new since [DATE].

## 2022-10-22 ENCOUNTER — Emergency Department (HOSPITAL_COMMUNITY)
Admission: EM | Admit: 2022-10-22 | Discharge: 2022-10-23 | Disposition: A | Discharge status: Home / Self Care | Payer: No Typology Code available for payment source | Attending: Emergency Medicine | Admitting: Emergency Medicine

## 2022-10-22 ENCOUNTER — Other Ambulatory Visit: Payer: Self-pay

## 2022-10-22 ENCOUNTER — Encounter (HOSPITAL_COMMUNITY): Payer: Self-pay

## 2022-10-22 ENCOUNTER — Emergency Department (HOSPITAL_COMMUNITY): Payer: No Typology Code available for payment source

## 2022-10-22 DIAGNOSIS — I160 Hypertensive urgency: Secondary | ICD-10-CM | POA: Insufficient documentation

## 2022-10-22 DIAGNOSIS — Z79890 Hormone replacement therapy (postmenopausal): Secondary | ICD-10-CM | POA: Insufficient documentation

## 2022-10-22 DIAGNOSIS — R519 Headache, unspecified: Secondary | ICD-10-CM

## 2022-10-22 DIAGNOSIS — Z79899 Other long term (current) drug therapy: Secondary | ICD-10-CM | POA: Diagnosis not present

## 2022-10-22 DIAGNOSIS — E039 Hypothyroidism, unspecified: Secondary | ICD-10-CM | POA: Diagnosis not present

## 2022-10-22 DIAGNOSIS — E876 Hypokalemia: Secondary | ICD-10-CM | POA: Diagnosis not present

## 2022-10-22 LAB — URINALYSIS, ROUTINE W REFLEX MICROSCOPIC
Bilirubin Urine: NEGATIVE
Glucose, UA: NEGATIVE mg/dL
Hgb urine dipstick: NEGATIVE
Ketones, ur: NEGATIVE mg/dL
Leukocytes,Ua: NEGATIVE
Nitrite: NEGATIVE
Protein, ur: NEGATIVE mg/dL
Specific Gravity, Urine: 1.004 — ABNORMAL LOW (ref 1.005–1.030)
pH: 7 (ref 5.0–8.0)

## 2022-10-22 LAB — CBC WITH DIFFERENTIAL/PLATELET
Abs Immature Granulocytes: 0.03 10*3/uL (ref 0.00–0.07)
Basophils Absolute: 0 10*3/uL (ref 0.0–0.1)
Basophils Relative: 1 %
Eosinophils Absolute: 0.1 10*3/uL (ref 0.0–0.5)
Eosinophils Relative: 1 %
HCT: 37.4 % (ref 36.0–46.0)
Hemoglobin: 12.7 g/dL (ref 12.0–15.0)
Immature Granulocytes: 0 %
Lymphocytes Relative: 31 %
Lymphs Abs: 2.6 10*3/uL (ref 0.7–4.0)
MCH: 33.6 pg (ref 26.0–34.0)
MCHC: 34 g/dL (ref 30.0–36.0)
MCV: 98.9 fL (ref 80.0–100.0)
Monocytes Absolute: 0.7 10*3/uL (ref 0.1–1.0)
Monocytes Relative: 9 %
Neutro Abs: 4.8 10*3/uL (ref 1.7–7.7)
Neutrophils Relative %: 58 %
Platelets: 184 10*3/uL (ref 150–400)
RBC: 3.78 MIL/uL — ABNORMAL LOW (ref 3.87–5.11)
RDW: 13 % (ref 11.5–15.5)
WBC: 8.3 10*3/uL (ref 4.0–10.5)
nRBC: 0 % (ref 0.0–0.2)

## 2022-10-22 LAB — BASIC METABOLIC PANEL
Anion gap: 8 (ref 5–15)
BUN: 10 mg/dL (ref 6–20)
CO2: 25 mmol/L (ref 22–32)
Calcium: 8.5 mg/dL — ABNORMAL LOW (ref 8.9–10.3)
Chloride: 102 mmol/L (ref 98–111)
Creatinine, Ser: 0.84 mg/dL (ref 0.44–1.00)
GFR, Estimated: >60 mL/min (ref >60)
Glucose, Bld: 98 mg/dL (ref 70–99)
Potassium: 2.8 mmol/L — ABNORMAL LOW (ref 3.5–5.1)
Sodium: 135 mmol/L (ref 135–145)

## 2022-10-22 MED ORDER — KETOROLAC TROMETHAMINE 15 MG/ML IJ SOLN
15.0000 mg | Freq: Once | INTRAMUSCULAR | Status: AC
  Administered 2022-10-22: 15 mg via INTRAVENOUS
  Filled 2022-10-22: qty 1

## 2022-10-22 MED ORDER — DIPHENHYDRAMINE HCL 50 MG/ML IJ SOLN
12.5000 mg | Freq: Once | INTRAMUSCULAR | Status: AC
  Administered 2022-10-22: 12.5 mg via INTRAVENOUS
  Filled 2022-10-22: qty 1

## 2022-10-22 MED ORDER — ACETAMINOPHEN 500 MG PO TABS
1000.0000 mg | ORAL_TABLET | Freq: Once | ORAL | Status: AC
  Administered 2022-10-22: 1000 mg via ORAL
  Filled 2022-10-22: qty 2

## 2022-10-22 MED ORDER — POTASSIUM CHLORIDE CRYS ER 20 MEQ PO TBCR
40.0000 meq | EXTENDED_RELEASE_TABLET | Freq: Once | ORAL | Status: AC
  Administered 2022-10-22: 40 meq via ORAL
  Filled 2022-10-22: qty 2

## 2022-10-22 MED ORDER — POTASSIUM CHLORIDE CRYS ER 20 MEQ PO TBCR: 20.0000 meq | EXTENDED_RELEASE_TABLET | Freq: Every day | ORAL | 0 refills | Status: DC

## 2022-10-22 MED ORDER — PROCHLORPERAZINE EDISYLATE 10 MG/2ML IJ SOLN
10.0000 mg | Freq: Once | INTRAMUSCULAR | Status: AC
  Administered 2022-10-22: 10 mg via INTRAVENOUS
  Filled 2022-10-22: qty 2

## 2022-10-22 NOTE — Discharge Instructions (Addendum)
As we discussed, only take the clonidine twice a day, once in the morning and once at night.

## 2022-10-22 NOTE — ED Triage Notes (Signed)
Pt was seen at urgent care today & her BP was 218/139, states they gave her 0.1mg  clonidine with instructions to take one every hour x20.  Pt states her bp is still running high after taking 5 does.  Pt reports headache, nausea & back pain.

## 2022-10-22 NOTE — ED Provider Notes (Signed)
EMERGENCY DEPARTMENT AT [facility] Provider Note   CSN: [id number] Arrival date & time: 10/22/22  2132     History  Chief Complaint  Patient presents with  . Hypertension    Jamie Johnson is a 57 y.o. female with HTN, hypothyroidism, bradycardia who presents with hypertension.   Patient has had a headache today, rated 5/10, located frontal region feels like "pressure" non-pulsating, not maximal at onset but never had a headache like this before. A/w nausea and feeling like she can't focus very well. She was tracking her blood pressure and had BPs in 160s-170s today which is abnormal for her. Went to urgent care today & her BP was 218/139, states they gave her 0.1mg  clonidine with instructions to take one every hour x20.  Pt states her blood pressure is still running high after taking 5 doses. Denies N/T, asymmetric weakness, CP, SOB, abd pain, leg swelling, hematuria. Hasn't missed any doses of her lisinopril 20 mg qd.    Hypertension       Home Medications Prior to Admission medications   Medication Sig Start Date End Date Taking? Authorizing Provider  potassium chloride SA (KLOR-CON M) 20 MEQ tablet Take 1 tablet (20 mEq total) by mouth daily for 3 days. 10/22/22 10/25/22 Yes Loetta Rough, MD  levothyroxine (SYNTHROID, LEVOTHROID) 88 MCG tablet Take 1 tablet (88 mcg total) by mouth daily. 01/24/15   Lorie Phenix, MD  lisinopril (ZESTRIL) 10 MG tablet 1 tab(s) orally once a day for 90 day(s)    [provider]      Allergies    Pseudoephedrine    Review of Systems   Review of Systems Review of systems Negative for CP, SOB.  A 10 point review of systems was performed and is negative unless otherwise reported in HPI.  Physical Exam Updated Vital Signs BP (!) 185/113   Pulse 69   Temp 98.3 F (36.8 C) (Oral)   Resp 16   Ht 5\' 7"  (1.702 m)   Wt 72.6 kg   SpO2 98%   BMI 25.06 kg/m  Physical Exam General: Normal appearing female, lying in  bed.  HEENT: PERRLA, EOMI, Sclera anicteric, MMM, trachea midline. NCAT.  Cardiology: RRR, no murmurs/rubs/gallops. BL radial and DP pulses equal bilaterally.  Resp: Normal respiratory rate and effort. CTAB, no wheezes, rhonchi, crackles.  Abd: Soft, non-tender, non-distended. No rebound tenderness or guarding.  GU: Deferred. MSK: No peripheral edema or signs of trauma. Extremities without deformity or TTP. No cyanosis or clubbing. Skin: warm, dry. Neuro: A&Ox4, CNs II-XII grossly intact. MAEs. Sensation grossly intact. Normal speech. Psych: Normal mood and affect.   ED Results / Procedures / Treatments   Labs (all labs ordered are listed, but only abnormal results are displayed) Labs Reviewed  CBC WITH DIFFERENTIAL/PLATELET - Abnormal; Notable for the following components:      Result Value   RBC 3.78 (*)    All other components within normal limits  BASIC METABOLIC PANEL - Abnormal; Notable for the following components:   Potassium 2.8 (*)    Calcium 8.5 (*)    All other components within normal limits  URINALYSIS, ROUTINE W REFLEX MICROSCOPIC - Abnormal; Notable for the following components:   Color, Urine STRAW (*)    Specific Gravity, Urine 1.004 (*)    All other components within normal limits    EKG EKG Interpretation  Date/Time:  Wednesday Oct 22 2022 22:35:35 EDT Ventricular Rate:  54 PR Interval:  168 QRS  Duration: 98 QT Interval:  499 QTC Calculation: 473 R Axis:   65 Text Interpretation: Normal sinus rhythm Confirmed by Vivi Barrack [phone]) on 10/22/2022 11:10:50 PM  Radiology No results found.  Procedures Procedures    Medications Ordered in ED Medications  acetaminophen (TYLENOL) tablet 1,000 mg (1,000 mg Oral Given 10/22/22 2309)  ketorolac (TORADOL) 15 MG/ML injection 15 mg (15 mg Intravenous Given 10/22/22 2307)  prochlorperazine (COMPAZINE) injection 10 mg (10 mg Intravenous Given 10/22/22 2308)  diphenhydrAMINE (BENADRYL) injection 12.5 mg (12.5 mg  Intravenous Given 10/22/22 2308)  potassium chloride SA (KLOR-CON M) CR tablet 40 mEq (40 mEq Oral Given 10/22/22 2314)    ED Course/ Medical Decision Making/ A&P                          Medical Decision Making Amount and/or Complexity of Data Reviewed Labs: ordered.  Risk OTC drugs. Prescription drug management.    This patient presents to the ED for concern of high blood pressure and headache, this involves an extensive number of treatment options, and is a complaint that carries with it a high risk of complications and morbidity.  I considered the following differential and admission for this acute, potentially life threatening condition.   MDM:    Patient is overall well appearing at this time. Blood pressure is 185/113 mmHg.   Patient does not have any symptoms on history nor signs on physical exam concerning for end organ damage secondary to hypertension.   Specifically, based up the patient's presentation, the patient is at sufficiently low risk for: -ACS given no CP, no SOB, normal cardio-pulmonary exam -SAH/stroke given no hx of acute stroke-like sxs and normal neurologic exam.  -end organ renal disease given no hematuria  For her headache, consider hypertensive urgency/emergency, though no FNDs, no double/blurry vision. Unlikely IIH. Unlikely SAH/ICH: headache is non thunderclap, but never had headache like this before, a/w HTN, will get CTH. Most likely 2/2 tension headache, migraine, or headache of non-emergent etiology. Will give headache cocktail and reexamine. Anticipate that pain control of HA will also decrease BP.    Clinical Course as of 10/22/22 2339  Wed Oct 22, 2022  2307 CBC with Differential(!) unremarkable [HN]  2308 Potassium(!): 2.8 +hypokalemia, unclear exactly why she would be hypokalemic given that patient takes lisinopril at home, which should result in hyperkalemia. Will replete.  [HN]  2310 Urinalysis, Routine w reflex microscopic -Urine, Clean  Catch(!) Unremarkable [HN]  2339 CT Head Wo Contrast Negative noncontrast head CT. [HN]  2339 BP(!): 163/96 [HN]    Clinical Course User Index [HN] Loetta Rough, MD    Labs: I Ordered, and personally interpreted labs.  The pertinent results include:  those listed above  Imaging Studies ordered: I ordered imaging studies including CTH I independently visualized and interpreted imaging. I agree with the radiologist interpretation  Additional history obtained from chart review.    Cardiac Monitoring: .The patient was maintained on a cardiac monitor.  I personally viewed and interpreted the cardiac monitored which showed an underlying rhythm o  Social Determinants of Health: .Patient lives independently   Disposition:  Patient is signed out to the oncoming ED physician Dr. Bebe Shaggy who is made aware of her history, presentation, exam, workup, and plan. If w/u reassuring and patient's BP/headache improve, plan will be to given potassium 20 mEq qd x 3 days and f/u with PCP about hypokalemia as well as BP control at home.  Co morbidities that complicate the patient evaluation . Past Medical History:  Diagnosis Date  . Arthritis   . Complication of anesthesia    has very slow heartrate-drops with any anesthesia to 28  . DJD (degenerative joint disease)   . Hypertension      Medicines Meds ordered this encounter  Medications  . acetaminophen (TYLENOL) tablet 1,000 mg  . ketorolac (TORADOL) 15 MG/ML injection 15 mg  . prochlorperazine (COMPAZINE) injection 10 mg  . diphenhydrAMINE (BENADRYL) injection 12.5 mg  . potassium chloride SA (KLOR-CON M) CR tablet 40 mEq  . potassium chloride SA (KLOR-CON M) 20 MEQ tablet    Sig: Take 1 tablet (20 mEq total) by mouth daily for 3 days.    Dispense:  3 tablet    Refill:  0    I have reviewed the patients home medicines and have made adjustments as needed  Problem List / ED Course: Problem List Items Addressed This Visit    None Visit Diagnoses     Hypertensive urgency    -  Primary   Acute nonintractable headache, unspecified headache type       Relevant Medications   acetaminophen (TYLENOL) tablet 1,000 mg (Completed)   ketorolac (TORADOL) 15 MG/ML injection 15 mg (Completed)   Hypokalemia                       This note was created using dictation software, which may contain spelling or grammatical errors.    Loetta Rough, MD 10/22/22 [phone]

## 2022-10-23 NOTE — ED Provider Notes (Signed)
Patient reports feeling improved.  She is in no acute distress.  No arm or leg drift.  She is mentating appropriately. She feels safe for discharge home when she is able to drive. We discussed need to only take clonidine twice a day   Zadie Rhine, MD 10/23/22 [phone]

## 2022-10-24 DIAGNOSIS — E559 Vitamin D deficiency, unspecified: Secondary | ICD-10-CM | POA: Insufficient documentation

## 2022-11-20 DIAGNOSIS — R079 Chest pain, unspecified: Secondary | ICD-10-CM | POA: Insufficient documentation

## 2023-03-06 ENCOUNTER — Inpatient Hospital Stay (HOSPITAL_COMMUNITY)
Admission: EM | Admit: 2023-03-06 | Discharge: 2023-03-08 | DRG: 206 | Disposition: A | Discharge status: Home / Self Care | Payer: PRIVATE HEALTH INSURANCE | Attending: Surgery | Admitting: Surgery

## 2023-03-06 ENCOUNTER — Emergency Department (HOSPITAL_COMMUNITY): Payer: PRIVATE HEALTH INSURANCE

## 2023-03-06 ENCOUNTER — Encounter (HOSPITAL_COMMUNITY): Payer: Self-pay

## 2023-03-06 ENCOUNTER — Other Ambulatory Visit: Payer: Self-pay

## 2023-03-06 DIAGNOSIS — Z96632 Presence of left artificial wrist joint: Secondary | ICD-10-CM | POA: Diagnosis present

## 2023-03-06 DIAGNOSIS — Z602 Problems related to living alone: Secondary | ICD-10-CM | POA: Diagnosis present

## 2023-03-06 DIAGNOSIS — A6920 Lyme disease, unspecified: Secondary | ICD-10-CM | POA: Diagnosis present

## 2023-03-06 DIAGNOSIS — I10 Essential (primary) hypertension: Secondary | ICD-10-CM | POA: Diagnosis present

## 2023-03-06 DIAGNOSIS — Z888 Allergy status to other drugs, medicaments and biological substances status: Secondary | ICD-10-CM

## 2023-03-06 DIAGNOSIS — Z79890 Hormone replacement therapy (postmenopausal): Secondary | ICD-10-CM

## 2023-03-06 DIAGNOSIS — Z836 Family history of other diseases of the respiratory system: Secondary | ICD-10-CM

## 2023-03-06 DIAGNOSIS — S2239XA Fracture of one rib, unspecified side, initial encounter for closed fracture: Secondary | ICD-10-CM | POA: Diagnosis present

## 2023-03-06 DIAGNOSIS — Z8249 Family history of ischemic heart disease and other diseases of the circulatory system: Secondary | ICD-10-CM | POA: Diagnosis not present

## 2023-03-06 DIAGNOSIS — Z825 Family history of asthma and other chronic lower respiratory diseases: Secondary | ICD-10-CM | POA: Diagnosis not present

## 2023-03-06 DIAGNOSIS — F17210 Nicotine dependence, cigarettes, uncomplicated: Secondary | ICD-10-CM | POA: Diagnosis present

## 2023-03-06 DIAGNOSIS — Z9889 Other specified postprocedural states: Secondary | ICD-10-CM

## 2023-03-06 DIAGNOSIS — Z8619 Personal history of other infectious and parasitic diseases: Secondary | ICD-10-CM

## 2023-03-06 DIAGNOSIS — S8012XA Contusion of left lower leg, initial encounter: Secondary | ICD-10-CM | POA: Diagnosis present

## 2023-03-06 DIAGNOSIS — F10129 Alcohol abuse with intoxication, unspecified: Secondary | ICD-10-CM | POA: Diagnosis not present

## 2023-03-06 DIAGNOSIS — S2232XA Fracture of one rib, left side, initial encounter for closed fracture: Principal | ICD-10-CM | POA: Diagnosis present

## 2023-03-06 DIAGNOSIS — Z79899 Other long term (current) drug therapy: Secondary | ICD-10-CM

## 2023-03-06 DIAGNOSIS — Z9089 Acquired absence of other organs: Secondary | ICD-10-CM

## 2023-03-06 DIAGNOSIS — Z7982 Long term (current) use of aspirin: Secondary | ICD-10-CM

## 2023-03-06 DIAGNOSIS — F10929 Alcohol use, unspecified with intoxication, unspecified: Secondary | ICD-10-CM

## 2023-03-06 DIAGNOSIS — Z90710 Acquired absence of both cervix and uterus: Secondary | ICD-10-CM

## 2023-03-06 DIAGNOSIS — Y92410 Unspecified street and highway as the place of occurrence of the external cause: Secondary | ICD-10-CM | POA: Diagnosis not present

## 2023-03-06 DIAGNOSIS — I959 Hypotension, unspecified: Secondary | ICD-10-CM | POA: Diagnosis present

## 2023-03-06 DIAGNOSIS — Y908 Blood alcohol level of 240 mg/100 ml or more: Secondary | ICD-10-CM | POA: Diagnosis present

## 2023-03-06 DIAGNOSIS — Z9049 Acquired absence of other specified parts of digestive tract: Secondary | ICD-10-CM

## 2023-03-06 DIAGNOSIS — S2220XA Unspecified fracture of sternum, initial encounter for closed fracture: Secondary | ICD-10-CM | POA: Diagnosis present

## 2023-03-06 DIAGNOSIS — Z1152 Encounter for screening for COVID-19: Secondary | ICD-10-CM

## 2023-03-06 DIAGNOSIS — Z823 Family history of stroke: Secondary | ICD-10-CM | POA: Diagnosis not present

## 2023-03-06 LAB — I-STAT CHEM 8, ED
BUN: 17 mg/dL (ref 6–20)
Calcium, Ion: 1.05 mmol/L — ABNORMAL LOW (ref 1.15–1.40)
Chloride: 102 mmol/L (ref 98–111)
Creatinine, Ser: 1.7 mg/dL — ABNORMAL HIGH (ref 0.44–1.00)
Glucose, Bld: 82 mg/dL (ref 70–99)
HCT: 37 % (ref 36.0–46.0)
Hemoglobin: 12.6 g/dL (ref 12.0–15.0)
Potassium: 3.4 mmol/L — ABNORMAL LOW (ref 3.5–5.1)
Sodium: 135 mmol/L (ref 135–145)
TCO2: 19 mmol/L — ABNORMAL LOW (ref 22–32)

## 2023-03-06 LAB — COMPREHENSIVE METABOLIC PANEL
ALT: 35 U/L (ref 0–44)
AST: 62 U/L — ABNORMAL HIGH (ref 15–41)
Albumin: 4 g/dL (ref 3.5–5.0)
Alkaline Phosphatase: 70 U/L (ref 38–126)
Anion gap: 19 — ABNORMAL HIGH (ref 5–15)
BUN: 16 mg/dL (ref 6–20)
CO2: 18 mmol/L — ABNORMAL LOW (ref 22–32)
Calcium: 9 mg/dL (ref 8.9–10.3)
Chloride: 98 mmol/L (ref 98–111)
Creatinine, Ser: 1.37 mg/dL — ABNORMAL HIGH (ref 0.44–1.00)
GFR, Estimated: 45 mL/min — ABNORMAL LOW (ref >60)
Glucose, Bld: 80 mg/dL (ref 70–99)
Potassium: 3.3 mmol/L — ABNORMAL LOW (ref 3.5–5.1)
Sodium: 135 mmol/L (ref 135–145)
Total Bilirubin: 0.6 mg/dL (ref 0.3–1.2)
Total Protein: 6.5 g/dL (ref 6.5–8.1)

## 2023-03-06 LAB — PROTIME-INR
INR: 1 (ref 0.8–1.2)
Prothrombin Time: 12.9 seconds (ref 11.4–15.2)

## 2023-03-06 LAB — CBC
HCT: 37.6 % (ref 36.0–46.0)
Hemoglobin: 12.4 g/dL (ref 12.0–15.0)
MCH: 32.3 pg (ref 26.0–34.0)
MCHC: 33 g/dL (ref 30.0–36.0)
MCV: 97.9 fL (ref 80.0–100.0)
Platelets: 212 10*3/uL (ref 150–400)
RBC: 3.84 MIL/uL — ABNORMAL LOW (ref 3.87–5.11)
RDW: 13.7 % (ref 11.5–15.5)
WBC: 7 10*3/uL (ref 4.0–10.5)
nRBC: 0 % (ref 0.0–0.2)

## 2023-03-06 LAB — SARS CORONAVIRUS 2 BY RT PCR: SARS Coronavirus 2 by RT PCR: NEGATIVE

## 2023-03-06 LAB — ETHANOL: Alcohol, Ethyl (B): 368 mg/dL (ref <10)

## 2023-03-06 MED ORDER — ENOXAPARIN SODIUM 30 MG/0.3ML IJ SOSY
30.0000 mg | PREFILLED_SYRINGE | Freq: Two times a day (BID) | INTRAMUSCULAR | Status: DC
  Administered 2023-03-07 – 2023-03-08 (×3): 30 mg via SUBCUTANEOUS
  Filled 2023-03-06 (×3): qty 0.3

## 2023-03-06 MED ORDER — ACETAMINOPHEN 325 MG PO TABS
650.0000 mg | ORAL_TABLET | Freq: Once | ORAL | Status: AC
  Administered 2023-03-06: 650 mg via ORAL
  Filled 2023-03-06: qty 2

## 2023-03-06 MED ORDER — ONDANSETRON 4 MG PO TBDP: 4.0000 mg | ORAL_TABLET | Freq: Four times a day (QID) | ORAL | Status: DC | PRN

## 2023-03-06 MED ORDER — FENTANYL CITRATE PF 50 MCG/ML IJ SOSY: 50.0000 ug | PREFILLED_SYRINGE | Freq: Once | INTRAMUSCULAR | Status: DC

## 2023-03-06 MED ORDER — ONDANSETRON HCL 4 MG/2ML IJ SOLN
4.0000 mg | Freq: Four times a day (QID) | INTRAMUSCULAR | Status: DC | PRN
  Administered 2023-03-07 – 2023-03-08 (×4): 4 mg via INTRAVENOUS
  Filled 2023-03-06 (×4): qty 2

## 2023-03-06 MED ORDER — LACTATED RINGERS IV SOLN: INTRAVENOUS | Status: DC

## 2023-03-06 MED ORDER — METOPROLOL TARTRATE 5 MG/5ML IV SOLN: 5.0000 mg | Freq: Four times a day (QID) | INTRAVENOUS | Status: DC | PRN

## 2023-03-06 MED ORDER — FOLIC ACID 1 MG PO TABS
1.0000 mg | ORAL_TABLET | Freq: Every day | ORAL | Status: DC
  Administered 2023-03-07 – 2023-03-08 (×2): 1 mg via ORAL
  Filled 2023-03-06 (×2): qty 1

## 2023-03-06 MED ORDER — OXYCODONE HCL 5 MG PO TABS: 5.0000 mg | ORAL_TABLET | ORAL | Status: DC | PRN

## 2023-03-06 MED ORDER — HYDROMORPHONE HCL 1 MG/ML IJ SOLN
0.5000 mg | INTRAMUSCULAR | Status: DC | PRN
  Administered 2023-03-07: 0.5 mg via INTRAVENOUS
  Filled 2023-03-06: qty 0.5

## 2023-03-06 MED ORDER — METHOCARBAMOL 500 MG PO TABS
500.0000 mg | ORAL_TABLET | Freq: Three times a day (TID) | ORAL | Status: DC
  Administered 2023-03-07 – 2023-03-08 (×4): 500 mg via ORAL
  Filled 2023-03-06 (×4): qty 1

## 2023-03-06 MED ORDER — METHOCARBAMOL 1000 MG/10ML IJ SOLN
500.0000 mg | Freq: Three times a day (TID) | INTRAVENOUS | Status: DC
  Administered 2023-03-07: 500 mg via INTRAVENOUS
  Filled 2023-03-06: qty 500

## 2023-03-06 MED ORDER — LORAZEPAM 1 MG PO TABS
1.0000 mg | ORAL_TABLET | ORAL | Status: DC | PRN
  Administered 2023-03-07 – 2023-03-08 (×4): 1 mg via ORAL
  Filled 2023-03-06 (×4): qty 1

## 2023-03-06 MED ORDER — THIAMINE HCL 100 MG/ML IJ SOLN: 100.0000 mg | Freq: Every day | INTRAMUSCULAR | Status: DC

## 2023-03-06 MED ORDER — OXYCODONE HCL 5 MG PO TABS
10.0000 mg | ORAL_TABLET | ORAL | Status: DC | PRN
  Administered 2023-03-07 – 2023-03-08 (×6): 10 mg via ORAL
  Filled 2023-03-06 (×7): qty 2

## 2023-03-06 MED ORDER — HYDRALAZINE HCL 20 MG/ML IJ SOLN: 10.0000 mg | INTRAMUSCULAR | Status: DC | PRN

## 2023-03-06 MED ORDER — THIAMINE MONONITRATE 100 MG PO TABS
100.0000 mg | ORAL_TABLET | Freq: Every day | ORAL | Status: DC
  Administered 2023-03-07 – 2023-03-08 (×2): 100 mg via ORAL
  Filled 2023-03-06 (×2): qty 1

## 2023-03-06 MED ORDER — FENTANYL CITRATE PF 50 MCG/ML IJ SOSY
25.0000 ug | PREFILLED_SYRINGE | Freq: Once | INTRAMUSCULAR | Status: AC
  Administered 2023-03-06: 25 ug via INTRAVENOUS
  Filled 2023-03-06: qty 1

## 2023-03-06 MED ORDER — ADULT MULTIVITAMIN W/MINERALS CH
1.0000 | ORAL_TABLET | Freq: Every day | ORAL | Status: DC
  Administered 2023-03-07 – 2023-03-08 (×2): 1 via ORAL
  Filled 2023-03-06 (×2): qty 1

## 2023-03-06 MED ORDER — DOCUSATE SODIUM 100 MG PO CAPS
100.0000 mg | ORAL_CAPSULE | Freq: Two times a day (BID) | ORAL | Status: DC
  Administered 2023-03-06 – 2023-03-08 (×4): 100 mg via ORAL
  Filled 2023-03-06 (×4): qty 1

## 2023-03-06 MED ORDER — IOHEXOL 350 MG/ML SOLN
60.0000 mL | Freq: Once | INTRAVENOUS | Status: AC | PRN
  Administered 2023-03-06: 60 mL via INTRAVENOUS

## 2023-03-06 MED ORDER — POLYETHYLENE GLYCOL 3350 17 G PO PACK
17.0000 g | PACK | Freq: Every day | ORAL | Status: DC | PRN
  Administered 2023-03-06: 17 g via ORAL
  Filled 2023-03-06: qty 1

## 2023-03-06 MED ORDER — ACETAMINOPHEN 500 MG PO TABS
1000.0000 mg | ORAL_TABLET | Freq: Four times a day (QID) | ORAL | Status: DC
  Administered 2023-03-07 – 2023-03-08 (×6): 1000 mg via ORAL
  Filled 2023-03-06 (×7): qty 2

## 2023-03-06 MED ORDER — HYDROMORPHONE HCL 1 MG/ML IJ SOLN
1.0000 mg | INTRAMUSCULAR | Status: DC | PRN
  Administered 2023-03-06 – 2023-03-07 (×4): 1 mg via INTRAVENOUS
  Filled 2023-03-06 (×4): qty 1

## 2023-03-06 MED ORDER — LACTATED RINGERS IV BOLUS
1000.0000 mL | Freq: Once | INTRAVENOUS | Status: AC
  Administered 2023-03-06: 1000 mL via INTRAVENOUS

## 2023-03-06 MED ORDER — LORAZEPAM 2 MG/ML IJ SOLN: 1.0000 mg | INTRAMUSCULAR | Status: DC | PRN
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
## 2023-03-06 NOTE — ED Notes (Signed)
Patient transported to CT 
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
## 2023-03-06 NOTE — ED Notes (Signed)
This NT walked into pt's room to find pt fully dressed and without any sort of monitoring on. When pt was asked what she was doing she said she was not going to keep cardiac or O2 monitors on because she keeps having to pee and did not want to take it on and off. Pt currently refusing monitoring and sitting up at the end of the bed. MD Silverio Lay and RN aware.
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
## 2023-03-07 LAB — COMPREHENSIVE METABOLIC PANEL
ALT: 51 U/L — ABNORMAL HIGH (ref 0–44)
AST: 107 U/L — ABNORMAL HIGH (ref 15–41)
Albumin: 3.3 g/dL — ABNORMAL LOW (ref 3.5–5.0)
Alkaline Phosphatase: 98 U/L (ref 38–126)
Anion gap: 9 (ref 5–15)
BUN: 14 mg/dL (ref 6–20)
CO2: 24 mmol/L (ref 22–32)
Calcium: 8.3 mg/dL — ABNORMAL LOW (ref 8.9–10.3)
Chloride: 99 mmol/L (ref 98–111)
Creatinine, Ser: 0.76 mg/dL (ref 0.44–1.00)
GFR, Estimated: >60 mL/min (ref >60)
Glucose, Bld: 96 mg/dL (ref 70–99)
Potassium: 3.3 mmol/L — ABNORMAL LOW (ref 3.5–5.1)
Sodium: 132 mmol/L — ABNORMAL LOW (ref 135–145)
Total Bilirubin: 1 mg/dL (ref 0.3–1.2)
Total Protein: 5.7 g/dL — ABNORMAL LOW (ref 6.5–8.1)

## 2023-03-07 LAB — BASIC METABOLIC PANEL
Anion gap: 13 (ref 5–15)
BUN: 14 mg/dL (ref 6–20)
CO2: 21 mmol/L — ABNORMAL LOW (ref 22–32)
Calcium: 8.4 mg/dL — ABNORMAL LOW (ref 8.9–10.3)
Chloride: 100 mmol/L (ref 98–111)
Creatinine, Ser: 0.8 mg/dL (ref 0.44–1.00)
GFR, Estimated: >60 mL/min (ref >60)
Glucose, Bld: 81 mg/dL (ref 70–99)
Potassium: 2.9 mmol/L — ABNORMAL LOW (ref 3.5–5.1)
Sodium: 134 mmol/L — ABNORMAL LOW (ref 135–145)

## 2023-03-07 LAB — CBC
HCT: 32.2 % — ABNORMAL LOW (ref 36.0–46.0)
Hemoglobin: 10.9 g/dL — ABNORMAL LOW (ref 12.0–15.0)
MCH: 33.5 pg (ref 26.0–34.0)
MCHC: 33.9 g/dL (ref 30.0–36.0)
MCV: 99.1 fL (ref 80.0–100.0)
Platelets: 151 10*3/uL (ref 150–400)
RBC: 3.25 MIL/uL — ABNORMAL LOW (ref 3.87–5.11)
RDW: 13.9 % (ref 11.5–15.5)
WBC: 6.8 10*3/uL (ref 4.0–10.5)
nRBC: 0 % (ref 0.0–0.2)

## 2023-03-07 LAB — MAGNESIUM
Magnesium: 1.6 mg/dL — ABNORMAL LOW (ref 1.7–2.4)
Magnesium: 1.4 mg/dL — ABNORMAL LOW (ref 1.7–2.4)

## 2023-03-07 LAB — PHOSPHORUS
Phosphorus: 4.8 mg/dL — ABNORMAL HIGH (ref 2.5–4.6)
Phosphorus: 4.2 mg/dL (ref 2.5–4.6)

## 2023-03-07 LAB — HIV ANTIBODY (ROUTINE TESTING W REFLEX): HIV Screen 4th Generation wRfx: NONREACTIVE

## 2023-03-07 MED ORDER — ESCITALOPRAM OXALATE 10 MG PO TABS
10.0000 mg | ORAL_TABLET | Freq: Every day | ORAL | Status: DC
  Administered 2023-03-07 – 2023-03-08 (×2): 10 mg via ORAL
  Filled 2023-03-07 (×2): qty 1

## 2023-03-07 MED ORDER — LEVOTHYROXINE SODIUM 88 MCG PO TABS
88.0000 ug | ORAL_TABLET | Freq: Every day | ORAL | Status: DC
  Administered 2023-03-07 – 2023-03-08 (×2): 88 ug via ORAL
  Filled 2023-03-07 (×2): qty 1

## 2023-03-07 MED ORDER — POTASSIUM CHLORIDE CRYS ER 20 MEQ PO TBCR
40.0000 meq | EXTENDED_RELEASE_TABLET | Freq: Once | ORAL | Status: AC
  Administered 2023-03-07: 40 meq via ORAL
  Filled 2023-03-07: qty 2

## 2023-03-07 MED ORDER — AMLODIPINE BESYLATE 10 MG PO TABS
10.0000 mg | ORAL_TABLET | Freq: Every day | ORAL | Status: DC
  Administered 2023-03-07 – 2023-03-08 (×2): 10 mg via ORAL
  Filled 2023-03-07 (×2): qty 1

## 2023-03-07 MED ORDER — LISINOPRIL 10 MG PO TABS
10.0000 mg | ORAL_TABLET | Freq: Every day | ORAL | Status: DC
  Administered 2023-03-08: 10 mg via ORAL
  Filled 2023-03-07 (×2): qty 1

## 2023-03-07 MED ORDER — MAGNESIUM SULFATE 4 GM/100ML IV SOLN
4.0000 g | Freq: Once | INTRAVENOUS | Status: AC
  Administered 2023-03-07: 4 g via INTRAVENOUS
  Filled 2023-03-07: qty 100
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
## 2023-03-07 NOTE — Plan of Care (Signed)
Problem: Activity: Goal: Risk for activity intolerance will decrease Outcome: Progressing   Problem: Coping: Goal: Level of anxiety will decrease Outcome: Progressing   Problem: Safety: Goal: Ability to remain free from injury will improve Outcome: Progressing

## 2023-03-07 NOTE — TOC CAGE-AID Note (Signed)
Transition of Care [facility]) - CAGE-AID Screening   Patient Details  Name: Jamie Johnson MRN: [id number] Date of Birth: Nov 03, 1965  Transition of Care [facility]) CM/SW Contact:    Leota Sauers, RN Phone Number: 03/07/2023, 6:48 AM   Clinical Narrative:  Patient endorses alcohol use, denies illicit drug use. TOC consult placed for SA.  CAGE-AID Screening:    Have You Ever Felt You Ought to Cut Down on Your Drinking or Drug Use?: No Have People Annoyed You By Critizing Your Drinking Or Drug Use?: No Have You Felt Bad Or Guilty About Your Drinking Or Drug Use?: No Have You Ever Had a Drink or Used Drugs First Thing In The Morning to Steady Your Nerves or to Get Rid of a Hangover?: No CAGE-AID Score: 0     Substance abuse interventions: Other (must comment) (TOC consult for SA)

## 2023-03-08 MED ORDER — METHOCARBAMOL 750 MG PO TABS
750.0000 mg | ORAL_TABLET | Freq: Three times a day (TID) | ORAL | 0 refills | Status: DC | PRN
Start: 2023-03-08 — End: 2023-03-08

## 2023-03-08 MED ORDER — OXYCODONE HCL 5 MG PO TABS
5.0000 mg | ORAL_TABLET | Freq: Four times a day (QID) | ORAL | 0 refills | Status: DC | PRN
Start: 2023-03-08 — End: 2023-03-08

## 2023-03-08 MED ORDER — METHOCARBAMOL 750 MG PO TABS
750.0000 mg | ORAL_TABLET | Freq: Three times a day (TID) | ORAL | 0 refills | Status: DC | PRN
Start: 2023-03-08 — End: 2024-01-13

## 2023-03-08 MED ORDER — OXYCODONE HCL 5 MG PO TABS
5.0000 mg | ORAL_TABLET | Freq: Four times a day (QID) | ORAL | 0 refills | Status: DC | PRN
Start: 2023-03-08 — End: 2024-01-13

## 2023-03-08 MED ORDER — ONDANSETRON HCL 4 MG PO TABS: 4.0000 mg | ORAL_TABLET | Freq: Three times a day (TID) | ORAL | 0 refills | Status: DC | PRN

## 2023-03-08 NOTE — Plan of Care (Signed)
Problem: Education: Goal: Knowledge of General Education information will improve Description: Including pain rating scale, medication(s)/side effects and non-pharmacologic comfort measures Outcome: Progressing   Problem: Health Behavior/Discharge Planning: Goal: Ability to manage health-related needs will improve Outcome: Progressing   Problem: Clinical Measurements: Goal: Ability to maintain clinical measurements within normal limits will improve Outcome: Progressing Goal: Will remain free from infection Outcome: Progressing Goal: Diagnostic test results will improve Outcome: Progressing Goal: Respiratory complications will improve Outcome: Progressing Goal: Cardiovascular complication will be avoided Outcome: Progressing   Problem: Activity: Goal: Risk for activity intolerance will decrease Outcome: Progressing   Problem: Nutrition: Goal: Adequate nutrition will be maintained Outcome: Progressing   Problem: Coping: Goal: Level of anxiety will decrease Outcome: Progressing   Problem: Skin Integrity: Goal: Risk for impaired skin integrity will decrease Outcome: Progressing
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
## 2023-03-08 NOTE — Plan of Care (Signed)

## 2023-03-08 NOTE — Discharge Instructions (Signed)
  [facility] -- DISCHARGE INSTRUCTIONS  REMINDER:   Carry a list of your medications and allergies with you at all times  Call your pharmacy at least 1 week in advance to refill prescriptions  Do not mix any prescribed pain medicine with alcohol  Do not drive any motor vehicles while taking pain medication  Take medications with food unless otherwise directed  Follow-up appointments (date to return to physician): Please call [phone] to confirm your follow up appointment with your surgeon.  Call your Surgeon if you have:  Temperature greater than 101.0  Persistent nausea and vomiting  Severe uncontrolled pain  Redness, tenderness, or signs of infection (pain, swelling, redness, odor or green/yellow discharge around the site)  Difficulty breathing, headache or visual disturbances  Hives  Persistent dizziness or light-headedness  Any other questions or concerns you may have after discharge  In an emergency, call 911 or go to an Emergency Department at a nearby hospital.  Diet: Begin with liquids, and if they are tolerated, resume your usual diet.  Avoid spicy, greasy or heavy foods.  If you have nausea or vomiting, go back to liquids.  If you cannot keep liquids down, call your doctor.  Avoid alcohol consumption while on prescription pain medications. Good nutrition promotes healing. Increase fiber and fluids.   ADDITIONAL INSTRUCTIONS: No lifting greater than 10lbs for 3 weeks. Out of work for one week - may return on Monday, September 30th.  [facility] Office: [phone]
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
## 2023-06-29 ENCOUNTER — Encounter: Payer: Self-pay | Admitting: *Deleted

## 2023-06-29 ENCOUNTER — Ambulatory Visit
Admission: EM | Admit: 2023-06-29 | Discharge: 2023-06-29 | Disposition: A | Discharge status: Home / Self Care | Payer: Medicaid Other | Attending: Family Medicine | Admitting: Family Medicine

## 2023-06-29 ENCOUNTER — Other Ambulatory Visit: Payer: Self-pay

## 2023-06-29 DIAGNOSIS — M2550 Pain in unspecified joint: Secondary | ICD-10-CM | POA: Insufficient documentation

## 2023-06-29 DIAGNOSIS — E860 Dehydration: Secondary | ICD-10-CM

## 2023-06-29 DIAGNOSIS — Z79890 Hormone replacement therapy (postmenopausal): Secondary | ICD-10-CM | POA: Insufficient documentation

## 2023-06-29 DIAGNOSIS — K529 Noninfective gastroenteritis and colitis, unspecified: Secondary | ICD-10-CM | POA: Diagnosis not present

## 2023-06-29 DIAGNOSIS — I10 Essential (primary) hypertension: Secondary | ICD-10-CM | POA: Diagnosis not present

## 2023-06-29 DIAGNOSIS — F1021 Alcohol dependence, in remission: Secondary | ICD-10-CM | POA: Insufficient documentation

## 2023-06-29 DIAGNOSIS — Z9882 Breast implant status: Secondary | ICD-10-CM | POA: Insufficient documentation

## 2023-06-29 DIAGNOSIS — E890 Postprocedural hypothyroidism: Secondary | ICD-10-CM | POA: Insufficient documentation

## 2023-06-29 DIAGNOSIS — Z90710 Acquired absence of both cervix and uterus: Secondary | ICD-10-CM | POA: Insufficient documentation

## 2023-06-29 DIAGNOSIS — F411 Generalized anxiety disorder: Secondary | ICD-10-CM | POA: Insufficient documentation

## 2023-06-29 DIAGNOSIS — Z8719 Personal history of other diseases of the digestive system: Secondary | ICD-10-CM | POA: Insufficient documentation

## 2023-06-29 DIAGNOSIS — F321 Major depressive disorder, single episode, moderate: Secondary | ICD-10-CM | POA: Insufficient documentation

## 2023-06-29 LAB — POCT URINALYSIS DIP (MANUAL ENTRY)
Blood, UA: NEGATIVE
Glucose, UA: NEGATIVE mg/dL
Leukocytes, UA: NEGATIVE
Nitrite, UA: NEGATIVE
Protein Ur, POC: 100 mg/dL — AB
Spec Grav, UA: >=1.03 — AB (ref 1.010–1.025)
Urobilinogen, UA: 1 U/dL
pH, UA: 5.5 (ref 5.0–8.0)

## 2023-06-29 MED ORDER — METOCLOPRAMIDE HCL 5 MG/ML IJ SOLN: 5.0000 mg | Freq: Once | INTRAMUSCULAR | Status: DC

## 2023-06-29 MED ORDER — METOCLOPRAMIDE HCL 5 MG/ML IJ SOLN
5.0000 mg | Freq: Once | INTRAMUSCULAR | Status: AC
  Administered 2023-06-29: 5 mg via INTRAVENOUS

## 2023-06-29 MED ORDER — PROCHLORPERAZINE MALEATE 5 MG PO TABS: 5.0000 mg | ORAL_TABLET | Freq: Four times a day (QID) | ORAL | 0 refills | Status: DC | PRN

## 2023-06-29 MED ORDER — SODIUM CHLORIDE 0.9 % IV BOLUS
1000.0000 mL | Freq: Once | INTRAVENOUS | Status: AC
  Administered 2023-06-29: 1000 mL via INTRAVENOUS

## 2023-06-29 NOTE — ED Triage Notes (Signed)
 Pt reports n/v/d with cough and fever x 3 days. Alert. Was able to keep some rice down last night

## 2023-06-29 NOTE — ED Provider Notes (Addendum)
 [facility]   [id number] 06/29/23 Arrival Time: 0808  ASSESSMENT & PLAN:  1. Gastroenteritis   2. Dehydration   3. Elevated blood pressure reading with diagnosis of hypertension    Feeling better after IVF and Reglan .  Meds ordered this encounter  Medications   sodium chloride  0.9 % bolus 1,000 mL   metoCLOPramide  (REGLAN ) injection 5 mg   prochlorperazine  (COMPAZINE ) 5 MG tablet    Sig: Take 1 tablet (5 mg total) by mouth every 6 (six) hours as needed for nausea or vomiting.    Dispense:  20 tablet    Refill:  0   Results for orders placed or performed during the hospital encounter of 06/29/23  POCT urinalysis dipstick   Collection Time: 06/29/23  9:00 AM  Result Value Ref Range   Color, UA other (A) yellow   Clarity, UA cloudy (A) clear   Glucose, UA negative negative mg/dL   Bilirubin, UA moderate (A) negative   Ketones, POC UA small (15) (A) negative mg/dL   Spec Grav, UA >=8.969 (A) 1.010 - 1.025   Blood, UA negative negative   pH, UA 5.5 5.0 - 8.0   Protein Ur, POC =100 (A) negative mg/dL   Urobilinogen, UA 1.0 0.2 or 1.0 E.U./dL   Nitrite, UA Negative Negative   Leukocytes, UA Negative Negative   Work note provided. Discussed typical duration of symptoms for suspected viral GI illness. Will do her best to ensure adequate fluid intake in order to avoid dehydration. Will proceed to the Emergency Department for evaluation if unable to tolerate PO fluids regularly.   Follow-up Information     Schedule an appointment as soon as possible for a visit  with Corrington, Kip A, MD.   Specialty: Family Medicine Why: For follow up and to recheck your blood pressure. Contact information: [address] [place] [address] [phone]                  Reviewed expectations re: course of current medical issues. Questions answered. Outlined signs and symptoms indicating need for more acute intervention. Patient verbalized  understanding. After Visit Summary given.   SUBJECTIVE: History from: patient. Jamie Johnson is a 58 y.o. female who presents with complaint of non-bilious, non-bloody n/v with non-bloody diarrhea. Onset  approx 3 days ago . Abdominal discomfort: mild and n/v. Symptoms are cramping. Unchanged since beginning. Aggravating factors: coffee and eating. Alleviating factors: none identified. Associated symptoms: fatigue. She denies chills, constipation, dysuria, and fever. Appetite: decreased. PO intake: decreased. Ambulatory without assistance. Sick contacts: none. Recent travel or camping: none. No tx PTA.  No LMP recorded. Patient has had a hysterectomy.  Increased blood pressure noted today. Reports that she is treated for HTN. Not taking meds today.  Past Surgical History:  Procedure Laterality Date   ABDOMINAL HYSTERECTOMY     TAH   APPENDECTOMY     CHOLECYSTECTOMY     FRACTURE SURGERY     right foot   KNEE ARTHROSCOPY     left   KNEE ARTHROSCOPY WITH LATERAL RELEASE  05/06/2012   Procedure: KNEE ARTHROSCOPY WITH LATERAL RELEASE;  Surgeon: Toribio JULIANNA Chancy, MD;  Location: [facility];  Service: Orthopedics;  Laterality: Right;  RIGHT KNEE ARTHROSCOPY WITH LATERAL RELEASE ,Excision Plica, Chondroplasty    SHOULDER ARTHROSCOPY     right   SHOULDER ARTHROSCOPY     left   TONSILLECTOMY     WRIST ARTHROPLASTY     left  OBJECTIVE:  Vitals:   06/29/23 0820 06/29/23 0954  BP: (!) 155/90 (!) 150/83  Pulse: 92 67  Resp: 18 16  Temp: 98.1 F (36.7 C) 98.1 F (36.7 C)  TempSrc: Oral Oral  SpO2: 97% 98%    General appearance: alert; no distress Oropharynx: dry Lungs: clear to auscultation bilaterally; unlabored Heart: regular rate and rhythm Abdomen: soft; non-distended; no significant abdominal tenderness; without guarding or rebound tenderness Back: no CVA tenderness Extremities: no edema; symmetrical with no gross deformities Skin: warm; dry Neurologic:  normal gait Psychological: alert and cooperative; normal mood and affect  Labs: Results for orders placed or performed during the hospital encounter of 06/29/23  POCT urinalysis dipstick   Collection Time: 06/29/23  9:00 AM  Result Value Ref Range   Color, UA other (A) yellow   Clarity, UA cloudy (A) clear   Glucose, UA negative negative mg/dL   Bilirubin, UA moderate (A) negative   Ketones, POC UA small (15) (A) negative mg/dL   Spec Grav, UA >=8.969 (A) 1.010 - 1.025   Blood, UA negative negative   pH, UA 5.5 5.0 - 8.0   Protein Ur, POC =100 (A) negative mg/dL   Urobilinogen, UA 1.0 0.2 or 1.0 E.U./dL   Nitrite, UA Negative Negative   Leukocytes, UA Negative Negative   Labs Reviewed  POCT URINALYSIS DIP (MANUAL ENTRY) - Abnormal; Notable for the following components:      Result Value   Color, UA other (*)    Clarity, UA cloudy (*)    Bilirubin, UA moderate (*)    Ketones, POC UA small (15) (*)    Spec Grav, UA >=1.030 (*)    Protein Ur, POC =100 (*)    All other components within normal limits     Allergies  Allergen Reactions   Pseudoephedrine     Makes her fell dizzy-spacy                                               Past Medical History:  Diagnosis Date   Arthritis    Complication of anesthesia    has very slow heartrate-drops with any anesthesia to 28   DJD (degenerative joint disease)    Hypertension    Social History   Socioeconomic History   Marital status: Single    Spouse name: Not on file   Number of children: Not on file   Years of education: Not on file   Highest education level: Not on file  Occupational History   Not on file  Tobacco Use   Smoking status: Every Day    Current packs/day: 0.25    Types: Cigarettes   Smokeless tobacco: Never  Vaping Use   Vaping status: Never Used  Substance and Sexual Activity   Alcohol use: Yes    Comment: occ   Drug use: No   Sexual activity: Not on file  Other Topics Concern   Not on file   Social History Narrative   Not on file   Social Drivers of Health   Financial Resource Strain: Medium Risk (10/24/2022)   Received from [facility]   Overall Financial Resource Strain (CARDIA)    Difficulty of Paying Living Expenses: Somewhat hard  Food Insecurity: No Food Insecurity (03/07/2023)   Hunger Vital Sign    Worried About Running Out of Food in the Last Year:  Never true    Ran Out of Food in the Last Year: Never true  Transportation Needs: No Transportation Needs (03/07/2023)   PRAPARE - Administrator, Civil Service (Medical): No    Lack of Transportation (Non-Medical): No  Physical Activity: Insufficiently Active (10/24/2022)   Received from [facility]   Exercise Vital Sign    Days of Exercise per Week: 2 days    Minutes of Exercise per Session: 20 min  Stress: No Stress Concern Present (10/24/2022)   Received from [facility] - Occupational Stress Questionnaire    Feeling of Stress : Only a little  Social Connections: Somewhat Isolated (10/24/2022)   Received from [facility]   Social Network    How would you rate your social network (family, work, friends)?: Restricted participation with some degree of social isolation  Intimate Partner Violence: Not At Risk (03/07/2023)   Humiliation, Afraid, Rape, and Kick questionnaire    Fear of Current or Ex-Partner: No    Emotionally Abused: No    Physically Abused: No    Sexually Abused: No   Family History  Problem Relation Age of Onset   Pulmonary fibrosis Mother    COPD Mother    Asthma Mother    Stroke Father    Heart disease Brother       Rolinda Rogue, MD 06/29/23 1003    Rolinda Rogue, MD 06/29/23 1004

## 2023-06-29 NOTE — Discharge Instructions (Addendum)
 Please do your best to ensure adequate fluid intake in order to avoid dehydration. If you find that you are unable to tolerate drinking fluids regularly please proceed to the Emergency Department for evaluation.  Your blood pressure was noted to be elevated during your visit today. If you are currently taking medication for high blood pressure, please ensure you are taking this as directed. If you do not have a history of high blood pressure and your blood pressure remains persistently elevated, you may need to begin taking a medication at some point. You may return here within the next few days to recheck if unable to see your primary care provider or if you do not have a one.  BP (!) 150/83 (BP Location: Left Arm)   Pulse 67   Temp 98.1 F (36.7 C) (Oral)   Resp 16   SpO2 98%   BP Readings from Last 3 Encounters:  06/29/23 (!) 150/83  03/08/23 (!) 143/77  10/22/22 (!) 163/96

## 2023-07-07 ENCOUNTER — Emergency Department (HOSPITAL_COMMUNITY): Payer: Medicaid Other

## 2023-07-07 ENCOUNTER — Emergency Department (HOSPITAL_COMMUNITY)
Admission: EM | Admit: 2023-07-07 | Discharge: 2023-07-07 | Disposition: A | Discharge status: Home / Self Care | Payer: Medicaid Other

## 2023-07-07 ENCOUNTER — Encounter (HOSPITAL_COMMUNITY): Payer: Self-pay | Admitting: *Deleted

## 2023-07-07 ENCOUNTER — Other Ambulatory Visit: Payer: Self-pay

## 2023-07-07 DIAGNOSIS — I10 Essential (primary) hypertension: Secondary | ICD-10-CM | POA: Insufficient documentation

## 2023-07-07 DIAGNOSIS — Z79899 Other long term (current) drug therapy: Secondary | ICD-10-CM | POA: Diagnosis not present

## 2023-07-07 DIAGNOSIS — M4312 Spondylolisthesis, cervical region: Secondary | ICD-10-CM | POA: Diagnosis not present

## 2023-07-07 DIAGNOSIS — N12 Tubulo-interstitial nephritis, not specified as acute or chronic: Secondary | ICD-10-CM | POA: Insufficient documentation

## 2023-07-07 DIAGNOSIS — Z7982 Long term (current) use of aspirin: Secondary | ICD-10-CM | POA: Insufficient documentation

## 2023-07-07 DIAGNOSIS — R519 Headache, unspecified: Secondary | ICD-10-CM | POA: Diagnosis not present

## 2023-07-07 DIAGNOSIS — Z20822 Contact with and (suspected) exposure to covid-19: Secondary | ICD-10-CM | POA: Diagnosis not present

## 2023-07-07 DIAGNOSIS — M50222 Other cervical disc displacement at C5-C6 level: Secondary | ICD-10-CM | POA: Diagnosis not present

## 2023-07-07 DIAGNOSIS — R112 Nausea with vomiting, unspecified: Secondary | ICD-10-CM | POA: Diagnosis not present

## 2023-07-07 DIAGNOSIS — R509 Fever, unspecified: Secondary | ICD-10-CM | POA: Diagnosis not present

## 2023-07-07 DIAGNOSIS — R197 Diarrhea, unspecified: Secondary | ICD-10-CM | POA: Diagnosis not present

## 2023-07-07 DIAGNOSIS — M50221 Other cervical disc displacement at C4-C5 level: Secondary | ICD-10-CM | POA: Diagnosis not present

## 2023-07-07 DIAGNOSIS — M4802 Spinal stenosis, cervical region: Secondary | ICD-10-CM | POA: Diagnosis not present

## 2023-07-07 DIAGNOSIS — R1032 Left lower quadrant pain: Secondary | ICD-10-CM | POA: Diagnosis not present

## 2023-07-07 DIAGNOSIS — S199XXA Unspecified injury of neck, initial encounter: Secondary | ICD-10-CM | POA: Diagnosis not present

## 2023-07-07 DIAGNOSIS — R531 Weakness: Secondary | ICD-10-CM | POA: Diagnosis not present

## 2023-07-07 DIAGNOSIS — M5020 Other cervical disc displacement, unspecified cervical region: Secondary | ICD-10-CM | POA: Diagnosis not present

## 2023-07-07 DIAGNOSIS — K760 Fatty (change of) liver, not elsewhere classified: Secondary | ICD-10-CM | POA: Diagnosis not present

## 2023-07-07 DIAGNOSIS — E039 Hypothyroidism, unspecified: Secondary | ICD-10-CM | POA: Diagnosis not present

## 2023-07-07 DIAGNOSIS — M47812 Spondylosis without myelopathy or radiculopathy, cervical region: Secondary | ICD-10-CM | POA: Diagnosis not present

## 2023-07-07 DIAGNOSIS — R404 Transient alteration of awareness: Secondary | ICD-10-CM | POA: Diagnosis not present

## 2023-07-07 LAB — CBC WITH DIFFERENTIAL/PLATELET
Abs Immature Granulocytes: 0.03 10*3/uL (ref 0.00–0.07)
Basophils Absolute: 0 10*3/uL (ref 0.0–0.1)
Basophils Relative: 1 %
Eosinophils Absolute: 0 10*3/uL (ref 0.0–0.5)
Eosinophils Relative: 0 %
HCT: 35.5 % — ABNORMAL LOW (ref 36.0–46.0)
Hemoglobin: 12.1 g/dL (ref 12.0–15.0)
Immature Granulocytes: 1 %
Lymphocytes Relative: 16 %
Lymphs Abs: 1.1 10*3/uL (ref 0.7–4.0)
MCH: 33.6 pg (ref 26.0–34.0)
MCHC: 34.1 g/dL (ref 30.0–36.0)
MCV: 98.6 fL (ref 80.0–100.0)
Monocytes Absolute: 0.6 10*3/uL (ref 0.1–1.0)
Monocytes Relative: 9 %
Neutro Abs: 4.7 10*3/uL (ref 1.7–7.7)
Neutrophils Relative %: 73 %
Platelets: 159 10*3/uL (ref 150–400)
RBC: 3.6 MIL/uL — ABNORMAL LOW (ref 3.87–5.11)
RDW: 14.4 % (ref 11.5–15.5)
WBC: 6.5 10*3/uL (ref 4.0–10.5)
nRBC: 0 % (ref 0.0–0.2)

## 2023-07-07 LAB — URINALYSIS, ROUTINE W REFLEX MICROSCOPIC
Bilirubin Urine: NEGATIVE
Glucose, UA: NEGATIVE mg/dL
Hgb urine dipstick: NEGATIVE
Ketones, ur: 20 mg/dL — AB
Nitrite: NEGATIVE
Protein, ur: 100 mg/dL — AB
Specific Gravity, Urine: 1.017 (ref 1.005–1.030)
WBC, UA: >50 WBC/hpf (ref 0–5)
pH: 8 (ref 5.0–8.0)

## 2023-07-07 LAB — COMPREHENSIVE METABOLIC PANEL
ALT: 57 U/L — ABNORMAL HIGH (ref 0–44)
AST: 115 U/L — ABNORMAL HIGH (ref 15–41)
Albumin: 4.5 g/dL (ref 3.5–5.0)
Alkaline Phosphatase: 84 U/L (ref 38–126)
Anion gap: 16 — ABNORMAL HIGH (ref 5–15)
BUN: 8 mg/dL (ref 6–20)
CO2: 27 mmol/L (ref 22–32)
Calcium: 8.9 mg/dL (ref 8.9–10.3)
Chloride: 95 mmol/L — ABNORMAL LOW (ref 98–111)
Creatinine, Ser: 0.67 mg/dL (ref 0.44–1.00)
GFR, Estimated: >60 mL/min (ref >60)
Glucose, Bld: 123 mg/dL — ABNORMAL HIGH (ref 70–99)
Potassium: 3.4 mmol/L — ABNORMAL LOW (ref 3.5–5.1)
Sodium: 138 mmol/L (ref 135–145)
Total Bilirubin: 2.2 mg/dL — ABNORMAL HIGH (ref 0.0–1.2)
Total Protein: 7.4 g/dL (ref 6.5–8.1)

## 2023-07-07 LAB — RESP PANEL BY RT-PCR (RSV, FLU A&B, COVID)  RVPGX2
Influenza A by PCR: NEGATIVE
Influenza B by PCR: NEGATIVE
Resp Syncytial Virus by PCR: NEGATIVE
SARS Coronavirus 2 by RT PCR: NEGATIVE

## 2023-07-07 LAB — LIPASE, BLOOD: Lipase: 43 U/L (ref 11–51)

## 2023-07-07 MED ORDER — SODIUM CHLORIDE 0.9 % IV SOLN
1.0000 g | Freq: Once | INTRAVENOUS | Status: AC
  Administered 2023-07-07: 1 g via INTRAVENOUS
  Filled 2023-07-07: qty 10

## 2023-07-07 MED ORDER — ONDANSETRON 8 MG PO TBDP
ORAL_TABLET | ORAL | Status: AC
  Administered 2023-07-07: 8 mg via ORAL
  Filled 2023-07-07: qty 1

## 2023-07-07 MED ORDER — ONDANSETRON HCL 4 MG/2ML IJ SOLN
4.0000 mg | Freq: Once | INTRAMUSCULAR | Status: AC
Start: 2023-07-07 — End: 2023-07-07
  Administered 2023-07-07: 4 mg via INTRAVENOUS
  Filled 2023-07-07: qty 2

## 2023-07-07 MED ORDER — SODIUM CHLORIDE 0.9 % IV BOLUS
1000.0000 mL | Freq: Once | INTRAVENOUS | Status: AC
  Administered 2023-07-07: 1000 mL via INTRAVENOUS

## 2023-07-07 MED ORDER — CEPHALEXIN 500 MG PO CAPS: 500.0000 mg | ORAL_CAPSULE | Freq: Four times a day (QID) | ORAL | 0 refills | Status: DC

## 2023-07-07 MED ORDER — NAPROXEN 500 MG PO TABS: 500.0000 mg | ORAL_TABLET | Freq: Two times a day (BID) | ORAL | 0 refills | Status: DC

## 2023-07-07 MED ORDER — DIPHENHYDRAMINE HCL 50 MG/ML IJ SOLN
12.5000 mg | Freq: Once | INTRAMUSCULAR | Status: AC
  Administered 2023-07-07: 12.5 mg via INTRAVENOUS
  Filled 2023-07-07: qty 1

## 2023-07-07 MED ORDER — MORPHINE SULFATE (PF) 4 MG/ML IV SOLN
4.0000 mg | Freq: Once | INTRAVENOUS | Status: AC
  Administered 2023-07-07: 4 mg via INTRAVENOUS
  Filled 2023-07-07: qty 1

## 2023-07-07 MED ORDER — ONDANSETRON 8 MG PO TBDP: 8.0000 mg | ORAL_TABLET | Freq: Once | ORAL | Status: AC

## 2023-07-07 MED ORDER — DEXAMETHASONE 4 MG PO TABS
6.0000 mg | ORAL_TABLET | Freq: Once | ORAL | Status: AC
  Administered 2023-07-07: 6 mg via ORAL
  Filled 2023-07-07: qty 1

## 2023-07-07 MED ORDER — PROCHLORPERAZINE EDISYLATE 10 MG/2ML IJ SOLN
10.0000 mg | Freq: Once | INTRAMUSCULAR | Status: AC
  Administered 2023-07-07: 10 mg via INTRAVENOUS
  Filled 2023-07-07: qty 2

## 2023-07-07 MED ORDER — IOHEXOL 300 MG/ML  SOLN
100.0000 mL | Freq: Once | INTRAMUSCULAR | Status: AC | PRN
  Administered 2023-07-07: 100 mL via INTRAVENOUS

## 2023-07-07 MED ORDER — ONDANSETRON 4 MG PO TBDP: 4.0000 mg | ORAL_TABLET | Freq: Three times a day (TID) | ORAL | 0 refills | Status: AC | PRN

## 2023-07-07 NOTE — ED Notes (Signed)
Pt refusing B/P stating it is hurting her arm.

## 2023-07-07 NOTE — Discharge Instructions (Addendum)
Take the nausea medication as needed.  Take the pain medication as needed, wear your collar and follow-up with the neurosurgeon.  Return to the ER for worsening symptoms.

## 2023-07-07 NOTE — ED Triage Notes (Signed)
Here by [facility]AR/EMS for NVD, onset last night. Endorses NVD, light headed, hot and cold sweats, chills, weakness, and fell this am. Denies fever, CP, sob, syncope, or injury. Reports scattered bruises and bruises easily. Seen 1/13 for "dehydration and gastroenteritis". Denies sick contacts, travel, or suspicious foods or events. Last ate yesterday. Actively vomiting. Alert, NAD, calm, interactive.

## 2023-07-07 NOTE — ED Provider Notes (Signed)
[place] [facility] DEPARTMENT AT [facility] Provider Note   CSN: [id number] Arrival date & time: 07/07/23  1000     History  Chief Complaint  Patient presents with   Emesis    Jamie Johnson is a 58 y.o. female.  58 year old female with past medical history of hypertension and hypothyroidism presenting to the emergency department today with nausea, vomiting, diarrhea.  The patient states she has been having this now for the past 2 days.  She reports that she has had multiple episodes of nonbloody, nonbilious emesis as well as multiple loose stools over the past few days.  She also reports some abdominal discomfort with this.  She states that this is diffuse.  She denies any associated urinary symptoms.  She states that she felt generally weak and fell yesterday.  She did hit her head but did not lose consciousness.  She is having a headache since then.  She is not on any blood thinners.   Emesis Associated symptoms: diarrhea and headaches        Home Medications Prior to Admission medications   Medication Sig Start Date End Date Taking? Authorizing Provider  ASPIRIN LOW DOSE 81 MG tablet Take 81 mg by mouth daily.   Yes [provider]  buPROPion (WELLBUTRIN XL) 150 MG 24 hr tablet Take 150 mg by mouth every morning.   Yes [provider]  cephALEXin (KEFLEX) 500 MG capsule Take 1 capsule (500 mg total) by mouth 4 (four) times daily. 07/07/23  Yes Durwin Glaze, MD  Cholecalciferol (VITAMIN D3) 25 MCG (1000 UT) CAPS Take 1,000 Units by mouth daily.   Yes [provider]  escitalopram (LEXAPRO) 10 MG tablet Take 10 mg by mouth daily.   Yes [provider]  estradiol (ESTRACE) 1 MG tablet Take 1 mg by mouth daily.   Yes [provider]  levothyroxine (SYNTHROID) 150 MCG tablet Take 150 mcg by mouth daily before breakfast.   Yes [provider]  naproxen (NAPROSYN) 500 MG tablet Take 1 tablet (500 mg total) by mouth 2  (two) times daily. 07/07/23  Yes Durwin Glaze, MD  ondansetron (ZOFRAN-ODT) 4 MG disintegrating tablet Take 1 tablet (4 mg total) by mouth every 8 (eight) hours as needed for nausea or vomiting. 07/07/23  Yes Durwin Glaze, MD  levothyroxine (SYNTHROID, LEVOTHROID) 88 MCG tablet Take 1 tablet (88 mcg total) by mouth daily. Patient not taking: Reported on 07/07/2023 01/24/15   Lorie Phenix, MD  methocarbamol (ROBAXIN) 750 MG tablet Take 1 tablet (750 mg total) by mouth every 8 (eight) hours as needed (use for muscle cramps/pain). Patient not taking: Reported on 07/07/2023 03/08/23   Fritzi Mandes, MD  oxyCODONE (OXY IR/ROXICODONE) 5 MG immediate release tablet Take 1 tablet (5 mg total) by mouth every 6 (six) hours as needed for moderate pain. Patient not taking: Reported on 07/07/2023 03/08/23   Fritzi Mandes, MD  potassium chloride SA (KLOR-CON M) 20 MEQ tablet Take 1 tablet (20 mEq total) by mouth daily for 3 days. Patient not taking: Reported on 07/07/2023 10/22/22 07/07/23  Loetta Rough, MD  prochlorperazine (COMPAZINE) 5 MG tablet Take 1 tablet (5 mg total) by mouth every 6 (six) hours as needed for nausea or vomiting. Patient not taking: Reported on 07/07/2023 06/29/23   Mardella Layman, MD      Allergies    Pseudoephedrine    Review of Systems   Review of Systems  Gastrointestinal:  Positive for  diarrhea, nausea and vomiting.  Neurological:  Positive for headaches.  All other systems reviewed and are negative.   Physical Exam Updated Vital Signs BP (!) 139/110 (BP Location: Right Arm)   Pulse 82   Temp 99.1 F (37.3 C) (Oral)   Resp 18   Wt 65.8 kg   SpO2 96%   BMI 22.72 kg/m  Physical Exam Vitals and nursing note reviewed.   Gen: NAD Eyes: PERRL, EOMI HEENT: no oropharyngeal swelling Neck: trachea midline Resp: clear to auscultation bilaterally Card: RRR, no murmurs, rubs, or gallops Abd: Mildly distended, mild diffuse tenderness with no guarding or rebound Extremities:  no calf tenderness, no edema Vascular: 2+ radial pulses bilaterally, 2+ DP pulses bilaterally Skin: no rashes Psyc: acting appropriately   ED Results / Procedures / Treatments   Labs (all labs ordered are listed, but only abnormal results are displayed) Labs Reviewed  COMPREHENSIVE METABOLIC PANEL - Abnormal; Notable for the following components:      Result Value   Potassium 3.4 (*)    Chloride 95 (*)    Glucose, Bld 123 (*)    AST 115 (*)    ALT 57 (*)    Total Bilirubin 2.2 (*)    Anion gap 16 (*)    All other components within normal limits  URINALYSIS, ROUTINE W REFLEX MICROSCOPIC - Abnormal; Notable for the following components:   APPearance HAZY (*)    Ketones, ur 20 (*)    Protein, ur 100 (*)    Leukocytes,Ua LARGE (*)    Bacteria, UA RARE (*)    All other components within normal limits  CBC WITH DIFFERENTIAL/PLATELET - Abnormal; Notable for the following components:   RBC 3.60 (*)    HCT 35.5 (*)    All other components within normal limits  RESP PANEL BY RT-PCR (RSV, FLU A&B, COVID)  RVPGX2  LIPASE, BLOOD    EKG None  Radiology CT ABDOMEN PELVIS W CONTRAST Result Date: 07/07/2023 CLINICAL DATA:  Left lower quadrant abdominal pain. EXAM: CT ABDOMEN AND PELVIS WITH CONTRAST TECHNIQUE: Multidetector CT imaging of the abdomen and pelvis was performed using the standard protocol following bolus administration of intravenous contrast. RADIATION DOSE REDUCTION: This exam was performed according to the departmental dose-optimization program which includes automated exposure control, adjustment of the mA and/or kV according to patient size and/or use of iterative reconstruction technique. CONTRAST:  OMNIPAQUE IOHEXOL 300 MG/ML  SOLN COMPARISON:  CT dated 03/06/2023. FINDINGS: Lower chest: The visualized lung bases are clear. No intra-abdominal free air or free fluid. Hepatobiliary: Fatty liver. No biliary dilatation. Cholecystectomy. No retained calcified stone noted  in the central CBD. Pancreas: Unremarkable. No pancreatic ductal dilatation or surrounding inflammatory changes. Spleen: Normal in size without focal abnormality. Adrenals/Urinary Tract: The adrenal glands are unremarkable. Subcentimeter left renal inferior pole hypodense focus is too small to characterize. There is no hydronephrosis on either side. There is symmetric enhancement and excretion of contrast by both kidneys. The visualized ureters and urinary bladder appear unremarkable. Stomach/Bowel: Diffuse thickened appearance of the colon likely related to underdistention. There is diffuse colonic submucosal fat deposit, likely sequela of chronic inflammatory process. There is no bowel obstruction or active inflammation. Appendectomy. Vascular/Lymphatic: The abdominal aorta and IVC are unremarkable. No portal venous gas. There is no adenopathy. Reproductive: Hysterectomy.  No suspicious adnexal masses. Other: None Musculoskeletal: Partially visualized bilateral breast implants. No acute osseous pathology. IMPRESSION: 1. No acute intra-abdominal or pelvic pathology. 2. Fatty liver. 3. Diffuse colonic submucosal  fat deposit, likely sequela of chronic inflammatory process. No bowel obstruction. Electronically Signed   By: Elgie Collard M.D.   On: 07/07/2023 13:24   CT Head Wo Contrast Result Date: 07/07/2023 CLINICAL DATA:  Provided history: Neck trauma, dangerous injury mechanism. Headache, increasing frequency or severity. EXAM: CT HEAD WITHOUT CONTRAST CT CERVICAL SPINE WITHOUT CONTRAST TECHNIQUE: Multidetector CT imaging of the head and cervical spine was performed following the standard protocol without intravenous contrast. Multiplanar CT image reconstructions of the cervical spine were also generated. RADIATION DOSE REDUCTION: This exam was performed according to the departmental dose-optimization program which includes automated exposure control, adjustment of the mA and/or kV according to patient size  and/or use of iterative reconstruction technique. COMPARISON:  Head CT 03/06/2023.  Cervical spine CT 03/06/2023. FINDINGS: CT HEAD FINDINGS Brain: There is no acute intracranial hemorrhage. No demarcated cortical infarct. No extra-axial fluid collection. No evidence of an intracranial mass. No midline shift. Vascular: No hyperdense vessel. Skull: No calvarial fracture or aggressive osseous lesion. Sinuses/Orbits: No mass or acute finding within the imaged orbits. Mild mucosal thickening within the right maxillary sinus at the imaged levels. Mild mucosal thickening in the right sphenoid sinus. CT CERVICAL SPINE FINDINGS Alignment: Nonspecific reversal of the expected cervical lordosis. 2 mm grade 1 anterolisthesis at C2-C3 and C3-C4. Skull base and vertebrae: The basion-dental and atlanto-dental intervals are maintained.No evidence of acute fracture to the cervical spine. Soft tissues and spinal canal: No prevertebral fluid or swelling. No visible canal hematoma. Disc levels: Cervical spondylosis with multilevel disc space narrowing, disc bulges/disc protrusions, endplate spurring and uncovertebral hypertrophy. Disc space narrowing is greatest at C4-C5, C5-C6 and C6-C7 (moderate to moderately advanced at these levels). Multilevel spinal canal narrowing. Most notably at C5-C6, a disc bulge contributes to apparent moderate spinal canal stenosis. Multilevel neural foraminal narrowing. Upper chest: No consolidation within the imaged lung apices. No visible pneumothorax. IMPRESSION: CT head: 1.  No evidence of an acute intracranial abnormality. 2. Mild paranasal sinus mucosal thickening at the imaged levels. CT cervical spine: 1. No evidence of acute cervical spine fracture. 2. Nonspecific reversal of the expected cervical lordosis. 3. Mild grade 1 anterolisthesis at C2-C3 and C3-C4 grade Electronically Signed   By: Jackey Loge D.O.   On: 07/07/2023 13:13   CT Cervical Spine Wo Contrast Result Date:  07/07/2023 CLINICAL DATA:  Provided history: Neck trauma, dangerous injury mechanism. Headache, increasing frequency or severity. EXAM: CT HEAD WITHOUT CONTRAST CT CERVICAL SPINE WITHOUT CONTRAST TECHNIQUE: Multidetector CT imaging of the head and cervical spine was performed following the standard protocol without intravenous contrast. Multiplanar CT image reconstructions of the cervical spine were also generated. RADIATION DOSE REDUCTION: This exam was performed according to the departmental dose-optimization program which includes automated exposure control, adjustment of the mA and/or kV according to patient size and/or use of iterative reconstruction technique. COMPARISON:  Head CT 03/06/2023.  Cervical spine CT 03/06/2023. FINDINGS: CT HEAD FINDINGS Brain: There is no acute intracranial hemorrhage. No demarcated cortical infarct. No extra-axial fluid collection. No evidence of an intracranial mass. No midline shift. Vascular: No hyperdense vessel. Skull: No calvarial fracture or aggressive osseous lesion. Sinuses/Orbits: No mass or acute finding within the imaged orbits. Mild mucosal thickening within the right maxillary sinus at the imaged levels. Mild mucosal thickening in the right sphenoid sinus. CT CERVICAL SPINE FINDINGS Alignment: Nonspecific reversal of the expected cervical lordosis. 2 mm grade 1 anterolisthesis at C2-C3 and C3-C4. Skull base and vertebrae: The basion-dental and atlanto-dental  intervals are maintained.No evidence of acute fracture to the cervical spine. Soft tissues and spinal canal: No prevertebral fluid or swelling. No visible canal hematoma. Disc levels: Cervical spondylosis with multilevel disc space narrowing, disc bulges/disc protrusions, endplate spurring and uncovertebral hypertrophy. Disc space narrowing is greatest at C4-C5, C5-C6 and C6-C7 (moderate to moderately advanced at these levels). Multilevel spinal canal narrowing. Most notably at C5-C6, a disc bulge contributes to  apparent moderate spinal canal stenosis. Multilevel neural foraminal narrowing. Upper chest: No consolidation within the imaged lung apices. No visible pneumothorax. IMPRESSION: CT head: 1.  No evidence of an acute intracranial abnormality. 2. Mild paranasal sinus mucosal thickening at the imaged levels. CT cervical spine: 1. No evidence of acute cervical spine fracture. 2. Nonspecific reversal of the expected cervical lordosis. 3. Mild grade 1 anterolisthesis at C2-C3 and C3-C4 grade Electronically Signed   By: Jackey Loge D.O.   On: 07/07/2023 13:13    Procedures Procedures    Medications Ordered in ED Medications  morphine (PF) 4 MG/ML injection 4 mg (has no administration in time range)  ondansetron (ZOFRAN) injection 4 mg (has no administration in time range)  ondansetron (ZOFRAN-ODT) disintegrating tablet 8 mg (8 mg Oral Given 07/07/23 1031)  prochlorperazine (COMPAZINE) injection 10 mg (10 mg Intravenous Given 07/07/23 1209)  diphenhydrAMINE (BENADRYL) injection 12.5 mg (12.5 mg Intravenous Given 07/07/23 1210)  sodium chloride 0.9 % bolus 1,000 mL (0 mLs Intravenous Stopped 07/07/23 1347)  iohexol (OMNIPAQUE) 300 MG/ML solution 100 mL (100 mLs Intravenous Contrast Given 07/07/23 1238)  cefTRIAXone (ROCEPHIN) 1 g in sodium chloride 0.9 % 100 mL IVPB (0 g Intravenous Stopped 07/07/23 1445)    ED Course/ Medical Decision Making/ A&P Clinical Course as of 07/07/23 1636  Tue Jul 07, 2023  1621 Received signout from Dr. Alahna Dunne; pending MRI.  Likely discharge with neurosurgery follow-up. [TY]    Clinical Course User Index [TY] Coral Spikes, DO                                 Medical Decision Making 58 year old female with past medical history of hypertension and hypothyroidism presenting to the emergency department today with abdominal pain, nausea, vomiting, and diarrhea.  Patient is also complaining of headache and is tender after a fall yesterday.  No further evaluate patient here with a  CT scan of her head and cervical spine to eval for acute traumatic abnormalities.  Will obtain basic labs to evaluate for anemia and electrolyte abnormalities.  I will obtain a CT scan of her abdomen to evaluate for appendicitis, diverticulitis, colitis, perforated viscus, or other intra-abdominal pathology.  I will give the patient Compazine and Benadryl for her nausea and reevaluate for ultimate disposition.  The patient CT scan shows some anterolisthesis.  I did call and discuss her case with broke up from neurosurgery who recommends an MRI.  She felt the patient could follow-up as an outpatient.  The patient does not have any focal neurological deficits here.  She is reporting more pain going down her arms than actual weakness.  MRI pending at the time of signout.  Amount and/or Complexity of Data Reviewed Labs: ordered. Radiology: ordered.  Risk Prescription drug management.           Final Clinical Impression(s) / ED Diagnoses Final diagnoses:  Nausea vomiting and diarrhea  Anterolisthesis of cervical spine  Pyelonephritis    Rx / DC Orders ED Discharge Orders  Ordered    cephALEXin (KEFLEX) 500 MG capsule  4 times daily        07/07/23 1538    naproxen (NAPROSYN) 500 MG tablet  2 times daily        07/07/23 1538    ondansetron (ZOFRAN-ODT) 4 MG disintegrating tablet  Every 8 hours PRN        07/07/23 1538              Durwin Glaze, MD 07/07/23 1636

## 2023-12-10 ENCOUNTER — Other Ambulatory Visit: Payer: Self-pay

## 2023-12-10 ENCOUNTER — Emergency Department
Admission: EM | Admit: 2023-12-10 | Discharge: 2023-12-10 | Disposition: A | Discharge status: Home / Self Care | Attending: Emergency Medicine | Admitting: Emergency Medicine

## 2023-12-10 ENCOUNTER — Emergency Department

## 2023-12-10 DIAGNOSIS — Z0489 Encounter for examination and observation for other specified reasons: Secondary | ICD-10-CM | POA: Diagnosis not present

## 2023-12-10 DIAGNOSIS — S40022A Contusion of left upper arm, initial encounter: Secondary | ICD-10-CM | POA: Insufficient documentation

## 2023-12-10 DIAGNOSIS — F1020 Alcohol dependence, uncomplicated: Secondary | ICD-10-CM | POA: Diagnosis not present

## 2023-12-10 DIAGNOSIS — M4802 Spinal stenosis, cervical region: Secondary | ICD-10-CM | POA: Diagnosis not present

## 2023-12-10 DIAGNOSIS — S99821A Other specified injuries of right foot, initial encounter: Secondary | ICD-10-CM | POA: Diagnosis not present

## 2023-12-10 DIAGNOSIS — M4311 Spondylolisthesis, occipito-atlanto-axial region: Secondary | ICD-10-CM | POA: Diagnosis not present

## 2023-12-10 DIAGNOSIS — S40021A Contusion of right upper arm, initial encounter: Secondary | ICD-10-CM | POA: Insufficient documentation

## 2023-12-10 DIAGNOSIS — Y908 Blood alcohol level of 240 mg/100 ml or more: Secondary | ICD-10-CM | POA: Insufficient documentation

## 2023-12-10 DIAGNOSIS — S134XXA Sprain of ligaments of cervical spine, initial encounter: Secondary | ICD-10-CM | POA: Diagnosis not present

## 2023-12-10 DIAGNOSIS — M47812 Spondylosis without myelopathy or radiculopathy, cervical region: Secondary | ICD-10-CM | POA: Diagnosis not present

## 2023-12-10 DIAGNOSIS — S93401A Sprain of unspecified ligament of right ankle, initial encounter: Secondary | ICD-10-CM | POA: Insufficient documentation

## 2023-12-10 DIAGNOSIS — M25571 Pain in right ankle and joints of right foot: Secondary | ICD-10-CM | POA: Diagnosis not present

## 2023-12-10 DIAGNOSIS — M542 Cervicalgia: Secondary | ICD-10-CM | POA: Insufficient documentation

## 2023-12-10 DIAGNOSIS — S199XXA Unspecified injury of neck, initial encounter: Secondary | ICD-10-CM | POA: Diagnosis not present

## 2023-12-10 LAB — CBC
HCT: 39.7 % (ref 36.0–46.0)
Hemoglobin: 13.4 g/dL (ref 12.0–15.0)
MCH: 33.4 pg (ref 26.0–34.0)
MCHC: 33.8 g/dL (ref 30.0–36.0)
MCV: 99 fL (ref 80.0–100.0)
Platelets: 244 10*3/uL (ref 150–400)
RBC: 4.01 MIL/uL (ref 3.87–5.11)
RDW: 14 % (ref 11.5–15.5)
WBC: 7.5 10*3/uL (ref 4.0–10.5)
nRBC: 0 % (ref 0.0–0.2)

## 2023-12-10 LAB — COMPREHENSIVE METABOLIC PANEL WITH GFR
ALT: 42 U/L (ref 0–44)
AST: 84 U/L — ABNORMAL HIGH (ref 15–41)
Albumin: 4.3 g/dL (ref 3.5–5.0)
Alkaline Phosphatase: 60 U/L (ref 38–126)
Anion gap: 14 (ref 5–15)
BUN: 14 mg/dL (ref 6–20)
CO2: 24 mmol/L (ref 22–32)
Calcium: 8.9 mg/dL (ref 8.9–10.3)
Chloride: 109 mmol/L (ref 98–111)
Creatinine, Ser: 0.87 mg/dL (ref 0.44–1.00)
GFR, Estimated: >60 mL/min (ref >60)
Glucose, Bld: 83 mg/dL (ref 70–99)
Potassium: 4.1 mmol/L (ref 3.5–5.1)
Sodium: 147 mmol/L — ABNORMAL HIGH (ref 135–145)
Total Bilirubin: 0.8 mg/dL (ref 0.0–1.2)
Total Protein: 7.2 g/dL (ref 6.5–8.1)

## 2023-12-10 LAB — URINE DRUG SCREEN, QUALITATIVE (ARMC ONLY)
Amphetamines, Ur Screen: NOT DETECTED
Barbiturates, Ur Screen: NOT DETECTED
Benzodiazepine, Ur Scrn: NOT DETECTED
Cannabinoid 50 Ng, Ur ~~LOC~~: NOT DETECTED
Cocaine Metabolite,Ur ~~LOC~~: NOT DETECTED
MDMA (Ecstasy)Ur Screen: NOT DETECTED
Methadone Scn, Ur: NOT DETECTED
Opiate, Ur Screen: NOT DETECTED
Phencyclidine (PCP) Ur S: NOT DETECTED
Tricyclic, Ur Screen: NOT DETECTED

## 2023-12-10 LAB — ETHANOL: Alcohol, Ethyl (B): 363 mg/dL (ref <15)

## 2023-12-10 MED ORDER — CYCLOBENZAPRINE HCL 5 MG PO TABS: 5.0000 mg | ORAL_TABLET | Freq: Three times a day (TID) | ORAL | 0 refills | Status: DC | PRN

## 2023-12-10 MED ORDER — CYCLOBENZAPRINE HCL 10 MG PO TABS
5.0000 mg | ORAL_TABLET | Freq: Once | ORAL | Status: AC
  Administered 2023-12-10: 5 mg via ORAL
  Filled 2023-12-10: qty 1
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
## 2023-12-15 DIAGNOSIS — M542 Cervicalgia: Secondary | ICD-10-CM | POA: Diagnosis not present

## 2023-12-15 DIAGNOSIS — E039 Hypothyroidism, unspecified: Secondary | ICD-10-CM | POA: Diagnosis not present

## 2023-12-15 DIAGNOSIS — Z0000 Encounter for general adult medical examination without abnormal findings: Secondary | ICD-10-CM | POA: Diagnosis not present

## 2023-12-15 DIAGNOSIS — Z13220 Encounter for screening for lipoid disorders: Secondary | ICD-10-CM | POA: Diagnosis not present

## 2023-12-15 DIAGNOSIS — Z9882 Breast implant status: Secondary | ICD-10-CM | POA: Diagnosis not present

## 2023-12-15 DIAGNOSIS — F1091 Alcohol use, unspecified, in remission: Secondary | ICD-10-CM | POA: Diagnosis not present

## 2023-12-15 DIAGNOSIS — Z113 Encounter for screening for infections with a predominantly sexual mode of transmission: Secondary | ICD-10-CM | POA: Diagnosis not present

## 2023-12-15 DIAGNOSIS — F17210 Nicotine dependence, cigarettes, uncomplicated: Secondary | ICD-10-CM | POA: Diagnosis not present

## 2023-12-15 DIAGNOSIS — I10 Essential (primary) hypertension: Secondary | ICD-10-CM | POA: Diagnosis not present

## 2023-12-15 DIAGNOSIS — Z1239 Encounter for other screening for malignant neoplasm of breast: Secondary | ICD-10-CM | POA: Diagnosis not present

## 2023-12-15 DIAGNOSIS — E876 Hypokalemia: Secondary | ICD-10-CM | POA: Diagnosis not present

## 2024-01-12 ENCOUNTER — Ambulatory Visit (HOSPITAL_COMMUNITY)
Admission: EM | Admit: 2024-01-12 | Discharge: 2024-01-14 | Disposition: A | Discharge status: Home / Self Care | Attending: Psychiatry | Admitting: Psychiatry

## 2024-01-12 DIAGNOSIS — Z79890 Hormone replacement therapy (postmenopausal): Secondary | ICD-10-CM | POA: Insufficient documentation

## 2024-01-12 DIAGNOSIS — M549 Dorsalgia, unspecified: Secondary | ICD-10-CM | POA: Insufficient documentation

## 2024-01-12 DIAGNOSIS — R232 Flushing: Secondary | ICD-10-CM | POA: Insufficient documentation

## 2024-01-12 DIAGNOSIS — R531 Weakness: Secondary | ICD-10-CM | POA: Insufficient documentation

## 2024-01-12 DIAGNOSIS — F411 Generalized anxiety disorder: Secondary | ICD-10-CM | POA: Insufficient documentation

## 2024-01-12 DIAGNOSIS — F1010 Alcohol abuse, uncomplicated: Secondary | ICD-10-CM | POA: Insufficient documentation

## 2024-01-12 DIAGNOSIS — Z560 Unemployment, unspecified: Secondary | ICD-10-CM | POA: Insufficient documentation

## 2024-01-12 DIAGNOSIS — Z5900 Homelessness unspecified: Secondary | ICD-10-CM | POA: Insufficient documentation

## 2024-01-12 DIAGNOSIS — F331 Major depressive disorder, recurrent, moderate: Secondary | ICD-10-CM | POA: Insufficient documentation

## 2024-01-12 DIAGNOSIS — R251 Tremor, unspecified: Secondary | ICD-10-CM | POA: Insufficient documentation

## 2024-01-12 DIAGNOSIS — Z79899 Other long term (current) drug therapy: Secondary | ICD-10-CM | POA: Insufficient documentation

## 2024-01-12 LAB — CBC WITH DIFFERENTIAL/PLATELET
Abs Immature Granulocytes: 0.03 K/uL (ref 0.00–0.07)
Basophils Absolute: 0.1 K/uL (ref 0.0–0.1)
Basophils Relative: 1 %
Eosinophils Absolute: 0 K/uL (ref 0.0–0.5)
Eosinophils Relative: 0 %
HCT: 42.4 % (ref 36.0–46.0)
Hemoglobin: 14.6 g/dL (ref 12.0–15.0)
Immature Granulocytes: 0 %
Lymphocytes Relative: 20 %
Lymphs Abs: 2 K/uL (ref 0.7–4.0)
MCH: 33.8 pg (ref 26.0–34.0)
MCHC: 34.4 g/dL (ref 30.0–36.0)
MCV: 98.1 fL (ref 80.0–100.0)
Monocytes Absolute: 0.7 K/uL (ref 0.1–1.0)
Monocytes Relative: 7 %
Neutro Abs: 7.5 K/uL (ref 1.7–7.7)
Neutrophils Relative %: 72 %
Platelets: 259 K/uL (ref 150–400)
RBC: 4.32 MIL/uL (ref 3.87–5.11)
RDW: 14.2 % (ref 11.5–15.5)
WBC: 10.4 K/uL (ref 4.0–10.5)
nRBC: 0 % (ref 0.0–0.2)

## 2024-01-12 LAB — POCT URINE DRUG SCREEN - MANUAL ENTRY (I-SCREEN)
POC Amphetamine UR: NOT DETECTED
POC Buprenorphine (BUP): NOT DETECTED
POC Cocaine UR: NOT DETECTED
POC Marijuana UR: NOT DETECTED
POC Methadone UR: NOT DETECTED
POC Methamphetamine UR: NOT DETECTED
POC Morphine: NOT DETECTED
POC Oxazepam (BZO): NOT DETECTED
POC Oxycodone UR: NOT DETECTED
POC Secobarbital (BAR): NOT DETECTED

## 2024-01-12 LAB — COMPREHENSIVE METABOLIC PANEL WITH GFR
ALT: 25 U/L (ref 0–44)
AST: 43 U/L — ABNORMAL HIGH (ref 15–41)
Albumin: 4.2 g/dL (ref 3.5–5.0)
Alkaline Phosphatase: 69 U/L (ref 38–126)
Anion gap: 12 (ref 5–15)
BUN: 9 mg/dL (ref 6–20)
CO2: 26 mmol/L (ref 22–32)
Calcium: 9.7 mg/dL (ref 8.9–10.3)
Chloride: 98 mmol/L (ref 98–111)
Creatinine, Ser: 0.99 mg/dL (ref 0.44–1.00)
GFR, Estimated: >60 mL/min (ref >60)
Glucose, Bld: 96 mg/dL (ref 70–99)
Potassium: 3.5 mmol/L (ref 3.5–5.1)
Sodium: 136 mmol/L (ref 135–145)
Total Bilirubin: 1.2 mg/dL (ref 0.0–1.2)
Total Protein: 6.7 g/dL (ref 6.5–8.1)

## 2024-01-12 LAB — ETHANOL: Alcohol, Ethyl (B): <15 mg/dL (ref <15)

## 2024-01-12 LAB — HEMOGLOBIN A1C
Hgb A1c MFr Bld: 4.6 % — ABNORMAL LOW (ref 4.8–5.6)
Mean Plasma Glucose: 85.32 mg/dL

## 2024-01-12 LAB — TSH: TSH: 43.386 u[IU]/mL — ABNORMAL HIGH (ref 0.350–4.500)

## 2024-01-12 LAB — POC URINE PREG, ED: Preg Test, Ur: NEGATIVE

## 2024-01-12 MED ORDER — ESTRADIOL 0.5 MG PO TABS
1.0000 mg | ORAL_TABLET | Freq: Every day | ORAL | Status: DC
  Administered 2024-01-13 – 2024-01-14 (×2): 1 mg via ORAL
  Filled 2024-01-12 (×2): qty 2

## 2024-01-12 MED ORDER — THIAMINE HCL 100 MG/ML IJ SOLN
100.0000 mg | Freq: Once | INTRAMUSCULAR | Status: AC
  Administered 2024-01-12: 100 mg via INTRAMUSCULAR
  Filled 2024-01-12: qty 2

## 2024-01-12 MED ORDER — CHLORDIAZEPOXIDE HCL 25 MG PO CAPS: 25.0000 mg | ORAL_CAPSULE | ORAL | Status: DC

## 2024-01-12 MED ORDER — TRAZODONE HCL 50 MG PO TABS
50.0000 mg | ORAL_TABLET | Freq: Every evening | ORAL | Status: DC | PRN
  Administered 2024-01-12 – 2024-01-13 (×2): 50 mg via ORAL
  Filled 2024-01-12 (×2): qty 1

## 2024-01-12 MED ORDER — BUPROPION HCL ER (XL) 150 MG PO TB24
150.0000 mg | ORAL_TABLET | Freq: Every morning | ORAL | Status: DC
  Administered 2024-01-13 – 2024-01-14 (×2): 150 mg via ORAL
  Filled 2024-01-12 (×2): qty 1

## 2024-01-12 MED ORDER — LEVOTHYROXINE SODIUM 88 MCG PO TABS
88.0000 ug | ORAL_TABLET | Freq: Every day | ORAL | Status: DC
  Administered 2024-01-12 – 2024-01-13 (×2): 88 ug via ORAL
  Filled 2024-01-12 (×2): qty 1

## 2024-01-12 MED ORDER — CYCLOBENZAPRINE HCL 10 MG PO TABS
5.0000 mg | ORAL_TABLET | Freq: Three times a day (TID) | ORAL | Status: DC | PRN
  Administered 2024-01-13: 5 mg via ORAL
  Filled 2024-01-12: qty 1

## 2024-01-12 MED ORDER — LOPERAMIDE HCL 2 MG PO CAPS: 2.0000 mg | ORAL_CAPSULE | ORAL | Status: DC | PRN

## 2024-01-12 MED ORDER — ADULT MULTIVITAMIN W/MINERALS CH
1.0000 | ORAL_TABLET | Freq: Every day | ORAL | Status: DC
  Administered 2024-01-13 – 2024-01-14 (×2): 1 via ORAL
  Filled 2024-01-12 (×2): qty 1

## 2024-01-12 MED ORDER — ONDANSETRON 4 MG PO TBDP
4.0000 mg | ORAL_TABLET | Freq: Four times a day (QID) | ORAL | Status: DC | PRN
  Administered 2024-01-13: 4 mg via ORAL
  Filled 2024-01-12: qty 1

## 2024-01-12 MED ORDER — CHLORDIAZEPOXIDE HCL 25 MG PO CAPS: 25.0000 mg | ORAL_CAPSULE | Freq: Four times a day (QID) | ORAL | Status: DC | PRN

## 2024-01-12 MED ORDER — MAGNESIUM HYDROXIDE 400 MG/5ML PO SUSP: 30.0000 mL | Freq: Every day | ORAL | Status: DC | PRN

## 2024-01-12 MED ORDER — CHLORDIAZEPOXIDE HCL 25 MG PO CAPS
25.0000 mg | ORAL_CAPSULE | Freq: Four times a day (QID) | ORAL | Status: AC
  Administered 2024-01-12 – 2024-01-14 (×6): 25 mg via ORAL
  Filled 2024-01-12 (×6): qty 1

## 2024-01-12 MED ORDER — OLANZAPINE 10 MG IM SOLR: 5.0000 mg | Freq: Three times a day (TID) | INTRAMUSCULAR | Status: DC | PRN

## 2024-01-12 MED ORDER — CHLORDIAZEPOXIDE HCL 25 MG PO CAPS: 25.0000 mg | ORAL_CAPSULE | Freq: Every day | ORAL | Status: DC

## 2024-01-12 MED ORDER — ALUM & MAG HYDROXIDE-SIMETH 200-200-20 MG/5ML PO SUSP: 30.0000 mL | ORAL | Status: DC | PRN

## 2024-01-12 MED ORDER — ACETAMINOPHEN 325 MG PO TABS
650.0000 mg | ORAL_TABLET | Freq: Four times a day (QID) | ORAL | Status: DC | PRN
  Administered 2024-01-13: 650 mg via ORAL
  Filled 2024-01-12: qty 2

## 2024-01-12 MED ORDER — CHLORDIAZEPOXIDE HCL 25 MG PO CAPS: 25.0000 mg | ORAL_CAPSULE | Freq: Three times a day (TID) | ORAL | Status: DC

## 2024-01-12 MED ORDER — HYDROXYZINE HCL 25 MG PO TABS
25.0000 mg | ORAL_TABLET | Freq: Four times a day (QID) | ORAL | Status: DC | PRN
  Administered 2024-01-12 – 2024-01-13 (×2): 25 mg via ORAL
  Filled 2024-01-12 (×2): qty 1

## 2024-01-12 MED ORDER — OLANZAPINE 5 MG PO TBDP: 5.0000 mg | ORAL_TABLET | Freq: Three times a day (TID) | ORAL | Status: DC | PRN
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
## 2024-01-13 ENCOUNTER — Encounter (HOSPITAL_COMMUNITY): Payer: Self-pay

## 2024-01-13 DIAGNOSIS — F1010 Alcohol abuse, uncomplicated: Secondary | ICD-10-CM | POA: Diagnosis not present

## 2024-01-13 DIAGNOSIS — M549 Dorsalgia, unspecified: Secondary | ICD-10-CM | POA: Diagnosis not present

## 2024-01-13 DIAGNOSIS — F331 Major depressive disorder, recurrent, moderate: Secondary | ICD-10-CM | POA: Diagnosis not present

## 2024-01-13 DIAGNOSIS — Z560 Unemployment, unspecified: Secondary | ICD-10-CM | POA: Diagnosis not present

## 2024-01-13 DIAGNOSIS — R531 Weakness: Secondary | ICD-10-CM | POA: Diagnosis not present

## 2024-01-13 DIAGNOSIS — Z79890 Hormone replacement therapy (postmenopausal): Secondary | ICD-10-CM | POA: Diagnosis not present

## 2024-01-13 DIAGNOSIS — F411 Generalized anxiety disorder: Secondary | ICD-10-CM | POA: Diagnosis not present

## 2024-01-13 DIAGNOSIS — Z5900 Homelessness unspecified: Secondary | ICD-10-CM | POA: Diagnosis not present

## 2024-01-13 DIAGNOSIS — R251 Tremor, unspecified: Secondary | ICD-10-CM | POA: Diagnosis not present

## 2024-01-13 DIAGNOSIS — Z79899 Other long term (current) drug therapy: Secondary | ICD-10-CM | POA: Diagnosis not present

## 2024-01-13 DIAGNOSIS — R232 Flushing: Secondary | ICD-10-CM | POA: Diagnosis not present

## 2024-01-13 LAB — LIPID PANEL
Cholesterol: 235 mg/dL — ABNORMAL HIGH (ref 0–200)
HDL: >135 mg/dL (ref >40)
Triglycerides: 151 mg/dL — ABNORMAL HIGH (ref <150)
VLDL: 30 mg/dL (ref 0–40)

## 2024-01-13 MED ORDER — NICOTINE 14 MG/24HR TD PT24
14.0000 mg | MEDICATED_PATCH | Freq: Every day | TRANSDERMAL | Status: DC
  Administered 2024-01-13 – 2024-01-14 (×2): 14 mg via TRANSDERMAL
  Filled 2024-01-13 (×2): qty 1

## 2024-01-13 MED ORDER — NAPROXEN 250 MG PO TABS
250.0000 mg | ORAL_TABLET | Freq: Two times a day (BID) | ORAL | Status: DC
  Administered 2024-01-13 – 2024-01-14 (×2): 250 mg via ORAL
  Filled 2024-01-13 (×2): qty 1

## 2024-01-13 NOTE — ED Notes (Signed)
 Pt sitting in dayroom eating lunch and interacting with peers. No acute distress noted. No concerns voiced. Informed pt to notify staff with any needs or assistance. Pt verbalized understanding and agreement. Will continue to monitor for safety.

## 2024-01-13 NOTE — Care Management (Addendum)
 OBS Care Management   Writer faxed referral to [facility], [facility] and [facility].   12:45pm  Writer met with the patient regarding options for treatment.  Patient has requested for her cat to come to treatment with her.  Writer informed her that the cat would not be able to come.   Patient reports that she may be able to stay with her brother in [place]  or a family friend in [place] .  Patient is going to contact them now.   Patient reports that she has lost everything and she is homeless and seeking to obtain disability benefits.    Therefore, patient declined [facility] or the [address] because they require you to work and pay to live at [employer] [facility] [place] and the [employer].   Patient has also declined outpatient CD-IOP group services because she is homeless.    2pm  Per Rosaline at [facility], the patient referral has been received and she is under review.   3:30pm  Accepted to [facility] on Monday January 18, 2024 at [facility].  Writer informed the patient.

## 2024-01-13 NOTE — ED Notes (Signed)
 Patient A&Ox4. Denies intent to harm self/others when asked. Denies A/VH. Patient denies any physical complaints when asked. No acute distress noted. Pt reports, I was having hot and cold flashes last night but not this morning. I want my Estradiol  before they start again though. Support and encouragement provided. Writer preparing am medication. Routine safety checks conducted according to facility protocol. Encouraged patient to notify staff if thoughts of harm toward self or others arise. Patient verbalize understanding and agreement. Will continue to monitor for safety.

## 2024-01-13 NOTE — ED Provider Notes (Signed)
 Behavioral Health Progress Note  Date and Time: 01/13/2024 2:06 PM Name: Jamie Johnson MRN:  [id number]  Subjective: On interview, patient did not get much sleep secondary to disturbances on the unit.  Is eating well.  Endorses desire to start inpatient substance use rehabilitation.  Has previously had alcohol withdrawal symptoms, not including alcoholic hallucinosis, DTs, or withdrawal seizure.  Last drink ~ 48 hours ago.  Feels tremulous and weak.  This morning was started on Librium  taper today.  Has never previously been on a Librium  taper.  Notes that she was drinking half a gallon of liquor every 2 days.  Denies suicidal homicidal ideation.  Denies auditory and visual hallucinations.  Has been making numerous calls to residential rehab facilities that can continue her 4-day Librium  taper -- is currently under review at [facility].  If this fails, patient has family in [place]  and [place]  that she is reaching out to with the possibility of discharging there.  Patient is currently without stable housing.  Patient is agreeable and in good spirits, largely.  Noted some back pain and ask for home Flexeril .  Tylenol /naproxen  given.  Diagnosis:  Final diagnoses:  Alcohol abuse  Moderate episode of recurrent major depressive disorder (HCC)  Homelessness    Total Time spent with patient: 20 minutes  Past Psychiatric History: Major depressive disorder versus substance-induced mood disorder, alcohol use disorder, severe, recurrent, in current episode Past Medical History: Patient has degenerative disk disease in upper C-spine, which she is in the process of receiving disability for.  Hypothyroidism (patient has an elevated TSH).  Currently going through menopause, prescribed estrogen. Family Psychiatric  History: Alcoholism in father Social History:   Additional Social History:    Pain Medications: see MAR Prescriptions: see MAR Over the Counter: see MAR History of alcohol / drug use?:  Yes Name of Substance 1: alcohol 1 - Amount (size/oz): half gallon 1 - Frequency: every other day 1 - Last Use / Amount: yesterday                  Sleep: Poor  Appetite:  Good  Current Medications:  Current Facility-Administered Medications  Medication Dose Route Frequency Provider Last Rate Last Admin   acetaminophen  (TYLENOL ) tablet 650 mg  650 mg Oral Q6H PRN Onuoha, Chinwendu V, NP       alum & mag hydroxide-simeth (MAALOX/MYLANTA) 200-200-20 MG/5ML suspension 30 mL  30 mL Oral Q4H PRN Onuoha, Chinwendu V, NP       buPROPion  (WELLBUTRIN  XL) 24 hr tablet 150 mg  150 mg Oral q morning Onuoha, Chinwendu V, NP   150 mg at 01/13/24 0901   chlordiazePOXIDE  (LIBRIUM ) capsule 25 mg  25 mg Oral Q6H PRN Onuoha, Chinwendu V, NP       chlordiazePOXIDE  (LIBRIUM ) capsule 25 mg  25 mg Oral QID Onuoha, Chinwendu V, NP   25 mg at 01/13/24 1338   Followed by   NOREEN ON 01/14/2024] chlordiazePOXIDE  (LIBRIUM ) capsule 25 mg  25 mg Oral TID Onuoha, Chinwendu V, NP       Followed by   NOREEN ON 01/15/2024] chlordiazePOXIDE  (LIBRIUM ) capsule 25 mg  25 mg Oral BH-qamhs Onuoha, Chinwendu V, NP       Followed by   NOREEN ON 01/16/2024] chlordiazePOXIDE  (LIBRIUM ) capsule 25 mg  25 mg Oral Daily Onuoha, Chinwendu V, NP       cyclobenzaprine  (FLEXERIL ) tablet 5 mg  5 mg Oral TID PRN Onuoha, Chinwendu V, NP       estradiol  (  ESTRACE ) tablet 1 mg  1 mg Oral Daily Onuoha, Chinwendu V, NP   1 mg at 01/13/24 0901   hydrOXYzine  (ATARAX ) tablet 25 mg  25 mg Oral Q6H PRN Onuoha, Chinwendu V, NP   25 mg at 01/12/24 2317   levothyroxine  (SYNTHROID ) tablet 88 mcg  88 mcg Oral QHS Onuoha, Chinwendu V, NP   88 mcg at 01/12/24 2340   loperamide  (IMODIUM ) capsule 2-4 mg  2-4 mg Oral PRN Onuoha, Chinwendu V, NP       magnesium  hydroxide (MILK OF MAGNESIA) suspension 30 mL  30 mL Oral Daily PRN Onuoha, Chinwendu V, NP       multivitamin with minerals tablet 1 tablet  1 tablet Oral Daily Onuoha, Chinwendu V, NP   1 tablet  at 01/13/24 0901   naproxen  (NAPROSYN ) tablet 250 mg  250 mg Oral BID WC Rollene Katz, MD   250 mg at 01/13/24 1338   nicotine  (NICODERM CQ  - dosed in mg/24 hours) patch 14 mg  14 mg Transdermal Daily Rollene Katz, MD   14 mg at 01/13/24 1020   OLANZapine  (ZYPREXA ) injection 5 mg  5 mg Intramuscular TID PRN Onuoha, Chinwendu V, NP       OLANZapine  zydis (ZYPREXA ) disintegrating tablet 5 mg  5 mg Oral TID PRN Onuoha, Chinwendu V, NP       ondansetron  (ZOFRAN -ODT) disintegrating tablet 4 mg  4 mg Oral Q6H PRN Onuoha, Chinwendu V, NP       traZODone  (DESYREL ) tablet 50 mg  50 mg Oral QHS PRN Onuoha, Chinwendu V, NP   50 mg at 01/12/24 2317   Current Outpatient Medications  Medication Sig Dispense Refill   buPROPion  (WELLBUTRIN  XL) 150 MG 24 hr tablet Take 150 mg by mouth every morning.     Cholecalciferol (VITAMIN D3) 25 MCG (1000 UT) CAPS Take 1,000 Units by mouth daily.     cyclobenzaprine  (FLEXERIL ) 5 MG tablet Take 1 tablet (5 mg total) by mouth 3 (three) times daily as needed (neck pain). 12 tablet 0   escitalopram  (LEXAPRO ) 10 MG tablet Take 10 mg by mouth daily.     estradiol  (ESTRACE ) 1 MG tablet Take 1 mg by mouth daily.     levothyroxine  (SYNTHROID ) 150 MCG tablet Take 150 mcg by mouth at bedtime.     ondansetron  (ZOFRAN -ODT) 4 MG disintegrating tablet Take 1 tablet (4 mg total) by mouth every 8 (eight) hours as needed for nausea or vomiting. 20 tablet 0   potassium chloride  (KLOR-CON  M) 10 MEQ tablet Take 10 mEq by mouth daily.     traMADol (ULTRAM) 50 MG tablet Take 50 mg by mouth every 6 (six) hours as needed (For pain). (Patient not taking: Reported on 01/13/2024)      Labs  Lab Results:  Admission on 01/12/2024  Component Date Value Ref Range Status   WBC 01/12/2024 10.4  4.0 - 10.5 K/uL Final   RBC 01/12/2024 4.32  3.87 - 5.11 MIL/uL Final   Hemoglobin 01/12/2024 14.6  12.0 - 15.0 g/dL Final   HCT 92/70/7974 42.4  36.0 - 46.0 % Final   MCV 01/12/2024 98.1  80.0 -  100.0 fL Final   MCH 01/12/2024 33.8  26.0 - 34.0 pg Final   MCHC 01/12/2024 34.4  30.0 - 36.0 g/dL Final   RDW 92/70/7974 14.2  11.5 - 15.5 % Final   Platelets 01/12/2024 259  150 - 400 K/uL Final   nRBC 01/12/2024 0.0  0.0 - 0.2 % Final  Neutrophils Relative % 01/12/2024 72  % Final   Neutro Abs 01/12/2024 7.5  1.7 - 7.7 K/uL Final   Lymphocytes Relative 01/12/2024 20  % Final   Lymphs Abs 01/12/2024 2.0  0.7 - 4.0 K/uL Final   Monocytes Relative 01/12/2024 7  % Final   Monocytes Absolute 01/12/2024 0.7  0.1 - 1.0 K/uL Final   Eosinophils Relative 01/12/2024 0  % Final   Eosinophils Absolute 01/12/2024 0.0  0.0 - 0.5 K/uL Final   Basophils Relative 01/12/2024 1  % Final   Basophils Absolute 01/12/2024 0.1  0.0 - 0.1 K/uL Final   Immature Granulocytes 01/12/2024 0  % Final   Abs Immature Granulocytes 01/12/2024 0.03  0.00 - 0.07 K/uL Final   Performed at [facility], [address], [place], [place] [address]   Sodium 01/12/2024 136  135 - 145 mmol/L Final   Potassium 01/12/2024 3.5  3.5 - 5.1 mmol/L Final   Chloride 01/12/2024 98  98 - 111 mmol/L Final   CO2 01/12/2024 26  22 - 32 mmol/L Final   Glucose, Bld 01/12/2024 96  70 - 99 mg/dL Final   Glucose reference range applies only to samples taken after fasting for at least 8 hours.   BUN 01/12/2024 9  6 - 20 mg/dL Final   Creatinine, Ser 01/12/2024 0.99  0.44 - 1.00 mg/dL Final   Calcium 92/70/7974 9.7  8.9 - 10.3 mg/dL Final   Total Protein 92/70/7974 6.7  6.5 - 8.1 g/dL Final   Albumin 92/70/7974 4.2  3.5 - 5.0 g/dL Final   AST 92/70/7974 43 (H)  15 - 41 U/L Final   ALT 01/12/2024 25  0 - 44 U/L Final   Alkaline Phosphatase 01/12/2024 69  38 - 126 U/L Final   Total Bilirubin 01/12/2024 1.2  0.0 - 1.2 mg/dL Final   GFR, Estimated 01/12/2024 >60  >60 mL/min Final   Comment: (NOTE) Calculated using the CKD-EPI Creatinine Equation (2021)    Anion gap 01/12/2024 12  5 - 15 Final   Performed at [facility],  [address], [place], [place] [address]   Hgb A1c MFr Bld 01/12/2024 4.6 (L)  4.8 - 5.6 % Final   Comment: (NOTE) Diagnosis of Diabetes The following HbA1c ranges recommended by the American Diabetes Association (ADA) may be used as an aid in the diagnosis of diabetes mellitus.  Hemoglobin             Suggested A1C NGSP%              Diagnosis  <5.7                   Non Diabetic  5.7-6.4                Pre-Diabetic  >6.4                   Diabetic  <7.0                   Glycemic control for                       adults with diabetes.     Mean Plasma Glucose 01/12/2024 85.32  mg/dL Final   Performed at [facility], [address] [place], [place] [address]   Alcohol, Ethyl (B) 01/12/2024 <15  <15 mg/dL Final   Comment: (NOTE) For medical purposes only. Performed at  [facility], [address] [place], [place] [address]    Cholesterol 01/12/2024 235 (H)  0 - 200 mg/dL Final   Triglycerides 92/70/7974 151 (H)  <150 mg/dL Final   HDL 92/70/7974 >135  >40 mg/dL Final   Total CHOL/HDL Ratio 01/12/2024 NOT CALCULATED  RATIO Final   VLDL 01/12/2024 30  0 - 40 mg/dL Final   LDL Cholesterol 01/12/2024 NOT CALCULATED  0 - 99 mg/dL Final   Performed at [facility], [address], [place], [place] [address]   TSH 01/12/2024 43.386 (H)  0.350 - 4.500 uIU/mL Final   Comment: Performed by a 3rd Generation assay with a functional sensitivity of <=0.01 uIU/mL. Performed at [facility], [address], [place], [place] [address]    Preg Test, Ur 01/12/2024 Negative  Negative Final   POC Amphetamine UR 01/12/2024 None Detected  NONE DETECTED (Cut Off Level 1000 ng/mL) Final   POC Secobarbital (BAR) 01/12/2024 None Detected  NONE DETECTED (Cut Off Level 300 ng/mL) Final   POC Buprenorphine (BUP) 01/12/2024 None Detected  NONE DETECTED (Cut Off Level 10 ng/mL) Final   POC Oxazepam (BZO) 01/12/2024 None Detected  NONE DETECTED (Cut Off Level 300 ng/mL) Final   POC  Cocaine UR 01/12/2024 None Detected  NONE DETECTED (Cut Off Level 300 ng/mL) Final   POC Methamphetamine UR 01/12/2024 None Detected  NONE DETECTED (Cut Off Level 1000 ng/mL) Final   POC Morphine  01/12/2024 None Detected  NONE DETECTED (Cut Off Level 300 ng/mL) Final   POC Methadone UR 01/12/2024 None Detected  NONE DETECTED (Cut Off Level 300 ng/mL) Final   POC Oxycodone  UR 01/12/2024 None Detected  NONE DETECTED (Cut Off Level 100 ng/mL) Final   POC Marijuana UR 01/12/2024 None Detected  NONE DETECTED (Cut Off Level 50 ng/mL) Final  Admission on 12/10/2023, Discharged on 12/10/2023  Component Date Value Ref Range Status   Sodium 12/10/2023 147 (H)  135 - 145 mmol/L Final   Potassium 12/10/2023 4.1  3.5 - 5.1 mmol/L Final   Chloride 12/10/2023 109  98 - 111 mmol/L Final   CO2 12/10/2023 24  22 - 32 mmol/L Final   Glucose, Bld 12/10/2023 83  70 - 99 mg/dL Final   Glucose reference range applies only to samples taken after fasting for at least 8 hours.   BUN 12/10/2023 14  6 - 20 mg/dL Final   Creatinine, Ser 12/10/2023 0.87  0.44 - 1.00 mg/dL Final   Calcium 93/73/7974 8.9  8.9 - 10.3 mg/dL Final   Total Protein 93/73/7974 7.2  6.5 - 8.1 g/dL Final   Albumin 93/73/7974 4.3  3.5 - 5.0 g/dL Final   AST 93/73/7974 84 (H)  15 - 41 U/L Final   ALT 12/10/2023 42  0 - 44 U/L Final   Alkaline Phosphatase 12/10/2023 60  38 - 126 U/L Final   Total Bilirubin 12/10/2023 0.8  0.0 - 1.2 mg/dL Final   GFR, Estimated 12/10/2023 >60  >60 mL/min Final   Comment: (NOTE) Calculated using the CKD-EPI Creatinine Equation (2021)    Anion gap 12/10/2023 14  5 - 15 Final   Performed at [facility], [address] [place], [place] [address]   Alcohol, Ethyl (B) 12/10/2023 363 (HH)  <15 mg/dL Final   Comment: CRITICAL RESULT CALLED TO, READ BACK BY AND VERIFIED WITH MAGGIE BARBER @1810  12/10/23 MJU (NOTE) For medical purposes only. Performed at [facility], [address]  [place],  [place] [address]    WBC 12/10/2023 7.5  4.0 - 10.5 K/uL Final   RBC 12/10/2023 4.01  3.87 - 5.11 MIL/uL Final   Hemoglobin 12/10/2023 13.4  12.0 - 15.0 g/dL Final   HCT 93/73/7974 39.7  36.0 - 46.0 % Final   MCV 12/10/2023 99.0  80.0 - 100.0 fL Final   MCH 12/10/2023 33.4  26.0 - 34.0 pg Final   MCHC 12/10/2023 33.8  30.0 - 36.0 g/dL Final   RDW 93/73/7974 14.0  11.5 - 15.5 % Final   Platelets 12/10/2023 244  150 - 400 K/uL Final   nRBC 12/10/2023 0.0  0.0 - 0.2 % Final   Performed at [facility], [address] [place], [place] [address]   Tricyclic, Ur Screen 12/10/2023 NONE DETECTED  NONE DETECTED Final   Amphetamines, Ur Screen 12/10/2023 NONE DETECTED  NONE DETECTED Final   MDMA (Ecstasy)Ur Screen 12/10/2023 NONE DETECTED  NONE DETECTED Final   Cocaine Metabolite,Ur [place] 12/10/2023 NONE DETECTED  NONE DETECTED Final   Opiate, Ur Screen 12/10/2023 NONE DETECTED  NONE DETECTED Final   Phencyclidine (PCP) Ur S 12/10/2023 NONE DETECTED  NONE DETECTED Final   Cannabinoid 50 Ng, Ur [place] 12/10/2023 NONE DETECTED  NONE DETECTED Final   Barbiturates, Ur Screen 12/10/2023 NONE DETECTED  NONE DETECTED Final   Benzodiazepine, Ur Scrn 12/10/2023 NONE DETECTED  NONE DETECTED Final   Methadone Scn, Ur 12/10/2023 NONE DETECTED  NONE DETECTED Final   Comment: (NOTE) Tricyclics + metabolites, urine    Cutoff 1000 ng/mL Amphetamines + metabolites, urine  Cutoff 1000 ng/mL MDMA (Ecstasy), urine              Cutoff 500 ng/mL Cocaine Metabolite, urine          Cutoff 300 ng/mL Opiate + metabolites, urine        Cutoff 300 ng/mL Phencyclidine (PCP), urine         Cutoff 25 ng/mL Cannabinoid, urine                 Cutoff 50 ng/mL Barbiturates + metabolites, urine  Cutoff 200 ng/mL Benzodiazepine, urine              Cutoff 200 ng/mL Methadone, urine                   Cutoff 300 ng/mL  The urine drug screen provides only a preliminary, unconfirmed analytical test result and should not be  used for non-medical purposes. Clinical consideration and professional judgment should be applied to any positive drug screen result due to possible interfering substances. A more specific alternate chemical method must be used in order to obtain a confirmed analytical result. Gas chromatography / mass spectrometry (GC/MS) is the preferred confirm                          atory method. Performed at [facility], [address] [place], [place] [address]     Blood Alcohol level:  Lab Results  Component Value Date   [facility] <15 01/12/2024   ETH 363 (HH) 12/10/2023    Metabolic Disorder Labs: Lab Results  Component Value Date   HGBA1C 4.6 (L) 01/12/2024   MPG 85.32 01/12/2024   No results found for: PROLACTIN Lab Results  Component Value Date   CHOL 235 (H) 01/12/2024   TRIG 151 (H) 01/12/2024   HDL >135 01/12/2024   CHOLHDL NOT CALCULATED 01/12/2024   VLDL 30 01/12/2024  LDLCALC NOT CALCULATED 01/12/2024    Therapeutic Lab Levels: No results found for: LITHIUM No results found for: VALPROATE No results found for: CBMZ  Physical Findings   CAGE-AID    Flowsheet Row ED to Hosp-Admission (Discharged) from 03/06/2023 in [facility]  CAGE-AID Score 0   Flowsheet Row ED from 01/12/2024 in [facility] ED from 12/10/2023 in [facility] Emergency Department at [facility] ED from 07/07/2023 in [facility] Emergency Department at [facility]  C-SSRS RISK CATEGORY High Risk No Risk No Risk     Musculoskeletal  Strength & Muscle Tone: within normal limits Gait & Station: normal Patient leans: N/A  Psychiatric Specialty Exam  Presentation  General Appearance:  Casual  Eye Contact: Good  Speech: Clear and Coherent  Speech Volume: Normal  Handedness: Right   Mood and Affect  Mood: Depressed  Affect: Congruent   Thought Process  Thought  Processes: Coherent  Descriptions of Associations:Intact  Orientation:Full (Time, Place and Person)  Thought Content:WDL  Diagnosis of Schizophrenia or Schizoaffective disorder in past: No    Hallucinations:Hallucinations: None  Ideas of Reference:None  Suicidal Thoughts:Suicidal Thoughts: No  Homicidal Thoughts:Homicidal Thoughts: No   Sensorium  Memory: Immediate Good  Judgment: Intact  Insight: Present   Executive Functions  Concentration: Good  Attention Span: Good  Recall: Good  Fund of Knowledge: Good  Language: Good   Psychomotor Activity  Psychomotor Activity: Psychomotor Activity: Normal   Assets  Assets: Communication Skills; Desire for Improvement   Sleep  Sleep: Sleep: Poor  No Safety Checks orders active in given range  Nutritional Assessment (For OBS and FBC admissions only) Has the patient had a weight loss or gain of 10 pounds or more in the last 3 months?: No Has the patient had a decrease in food intake/or appetite?: No Does the patient have dental problems?: No Does the patient have eating habits or behaviors that may be indicators of an eating disorder including binging or inducing vomiting?: No Has the patient recently lost weight without trying?: 0 Has the patient been eating poorly because of a decreased appetite?: 0 Malnutrition Screening Tool Score: 0    Physical Exam  Physical Exam Vitals reviewed.  Constitutional:      General: She is not in acute distress. Pulmonary:     Effort: Pulmonary effort is normal. No respiratory distress.  Neurological:     Mental Status: She is alert.    Review of Systems  Constitutional:  Negative for chills and fever.   Blood pressure (!) 154/93, pulse 84, temperature 98.8 F (37.1 C), temperature source Oral, resp. rate 18, SpO2 99%. There is no height or weight on file to calculate BMI.  Treatment Plan Summary: Daily contact with patient to assess and evaluate symptoms  and progress in treatment  Continue Librium  taper: 25 mg 4 times daily 7/30, 3 times daily 7/31, twice daily 8/1 and once 8/2. Social work currently seeking placement in long-term substance use residential facilities.  Giann Obara, MD 01/13/2024 2:06 PM

## 2024-01-13 NOTE — ED Notes (Signed)
 Pt A&Ox 4. Pt denies SI/HI/AVH. Pt oriented to the unit. Pt accepted meal and beverage. Pt c/o neck and spine p[ain rated 6/10 that she says she has had for a month due to degenerative disc disease. PRN Flexeril  5 mg given as per NP order. She also c/o nausea w/o vomiting and cold sweats. Pt given PRN Zofran  4 mg as per NP order. Support and encouragement provided. Facility protocol safety checks in place. Pt encouraged to notify staff if thoughts of hurting themselves or others arise. Pt verbalized understanding and agreement. Pt is currently safe on the unit.

## 2024-01-14 ENCOUNTER — Other Ambulatory Visit (HOSPITAL_COMMUNITY)
Admission: EM | Admit: 2024-01-14 | Discharge: 2024-01-18 | Source: Intra-hospital | Attending: Psychiatry | Admitting: Psychiatry

## 2024-01-14 DIAGNOSIS — N951 Menopausal and female climacteric states: Secondary | ICD-10-CM | POA: Insufficient documentation

## 2024-01-14 DIAGNOSIS — Z560 Unemployment, unspecified: Secondary | ICD-10-CM | POA: Diagnosis not present

## 2024-01-14 DIAGNOSIS — F411 Generalized anxiety disorder: Secondary | ICD-10-CM | POA: Insufficient documentation

## 2024-01-14 DIAGNOSIS — Z79899 Other long term (current) drug therapy: Secondary | ICD-10-CM | POA: Diagnosis not present

## 2024-01-14 DIAGNOSIS — F332 Major depressive disorder, recurrent severe without psychotic features: Secondary | ICD-10-CM

## 2024-01-14 DIAGNOSIS — Z5900 Homelessness unspecified: Secondary | ICD-10-CM | POA: Insufficient documentation

## 2024-01-14 DIAGNOSIS — F1020 Alcohol dependence, uncomplicated: Secondary | ICD-10-CM | POA: Diagnosis present

## 2024-01-14 DIAGNOSIS — G629 Polyneuropathy, unspecified: Secondary | ICD-10-CM | POA: Insufficient documentation

## 2024-01-14 DIAGNOSIS — Z9151 Personal history of suicidal behavior: Secondary | ICD-10-CM | POA: Diagnosis not present

## 2024-01-14 DIAGNOSIS — F10230 Alcohol dependence with withdrawal, uncomplicated: Secondary | ICD-10-CM

## 2024-01-14 DIAGNOSIS — Z87891 Personal history of nicotine dependence: Secondary | ICD-10-CM | POA: Insufficient documentation

## 2024-01-14 DIAGNOSIS — G4700 Insomnia, unspecified: Secondary | ICD-10-CM | POA: Diagnosis not present

## 2024-01-14 DIAGNOSIS — E039 Hypothyroidism, unspecified: Secondary | ICD-10-CM | POA: Insufficient documentation

## 2024-01-14 DIAGNOSIS — Z79890 Hormone replacement therapy (postmenopausal): Secondary | ICD-10-CM | POA: Insufficient documentation

## 2024-01-14 DIAGNOSIS — F909 Attention-deficit hyperactivity disorder, unspecified type: Secondary | ICD-10-CM | POA: Insufficient documentation

## 2024-01-14 DIAGNOSIS — R630 Anorexia: Secondary | ICD-10-CM | POA: Insufficient documentation

## 2024-01-14 MED ORDER — ESCITALOPRAM OXALATE 10 MG PO TABS
10.0000 mg | ORAL_TABLET | Freq: Every day | ORAL | Status: DC
  Administered 2024-01-15 – 2024-01-17 (×3): 10 mg via ORAL
  Filled 2024-01-14 (×3): qty 1
  Filled 2024-01-14: qty 14

## 2024-01-14 MED ORDER — DIPHENHYDRAMINE HCL 50 MG/ML IJ SOLN: 50.0000 mg | Freq: Three times a day (TID) | INTRAMUSCULAR | Status: DC | PRN

## 2024-01-14 MED ORDER — CHLORDIAZEPOXIDE HCL 25 MG PO CAPS
25.0000 mg | ORAL_CAPSULE | Freq: Once | ORAL | Status: AC
  Administered 2024-01-16: 25 mg via ORAL
  Filled 2024-01-14: qty 1

## 2024-01-14 MED ORDER — HALOPERIDOL 5 MG PO TABS: 5.0000 mg | ORAL_TABLET | Freq: Three times a day (TID) | ORAL | Status: DC | PRN

## 2024-01-14 MED ORDER — CHLORDIAZEPOXIDE HCL 25 MG PO CAPS: 25.0000 mg | ORAL_CAPSULE | Freq: Two times a day (BID) | ORAL | Status: DC

## 2024-01-14 MED ORDER — ONDANSETRON 4 MG PO TBDP: 4.0000 mg | ORAL_TABLET | Freq: Four times a day (QID) | ORAL | Status: AC | PRN

## 2024-01-14 MED ORDER — LORAZEPAM 2 MG/ML IJ SOLN: 2.0000 mg | Freq: Three times a day (TID) | INTRAMUSCULAR | Status: DC | PRN

## 2024-01-14 MED ORDER — DIPHENHYDRAMINE HCL 50 MG PO CAPS: 50.0000 mg | ORAL_CAPSULE | Freq: Three times a day (TID) | ORAL | Status: DC | PRN

## 2024-01-14 MED ORDER — CHLORDIAZEPOXIDE HCL 25 MG PO CAPS
25.0000 mg | ORAL_CAPSULE | Freq: Two times a day (BID) | ORAL | Status: AC
  Administered 2024-01-15 (×2): 25 mg via ORAL
  Filled 2024-01-14 (×2): qty 1

## 2024-01-14 MED ORDER — HYDROXYZINE HCL 25 MG PO TABS
25.0000 mg | ORAL_TABLET | Freq: Three times a day (TID) | ORAL | Status: DC | PRN
Start: 2024-01-14 — End: 2024-01-18
  Administered 2024-01-14 – 2024-01-16 (×3): 25 mg via ORAL
  Filled 2024-01-14 (×3): qty 1
  Filled 2024-01-14: qty 15

## 2024-01-14 MED ORDER — THIAMINE MONONITRATE 100 MG PO TABS
100.0000 mg | ORAL_TABLET | Freq: Every day | ORAL | Status: DC
  Administered 2024-01-15 – 2024-01-17 (×3): 100 mg via ORAL
  Filled 2024-01-14 (×3): qty 1

## 2024-01-14 MED ORDER — ALUM & MAG HYDROXIDE-SIMETH 200-200-20 MG/5ML PO SUSP: 30.0000 mL | ORAL | Status: DC | PRN

## 2024-01-14 MED ORDER — CHLORDIAZEPOXIDE HCL 25 MG PO CAPS: 25.0000 mg | ORAL_CAPSULE | Freq: Four times a day (QID) | ORAL | Status: AC | PRN

## 2024-01-14 MED ORDER — GABAPENTIN 100 MG PO CAPS
100.0000 mg | ORAL_CAPSULE | Freq: Three times a day (TID) | ORAL | Status: DC
  Administered 2024-01-14 – 2024-01-16 (×6): 100 mg via ORAL
  Filled 2024-01-14 (×7): qty 1

## 2024-01-14 MED ORDER — NICOTINE 14 MG/24HR TD PT24
14.0000 mg | MEDICATED_PATCH | Freq: Every day | TRANSDERMAL | Status: DC
  Administered 2024-01-15 – 2024-01-16 (×2): 14 mg via TRANSDERMAL
  Filled 2024-01-14 (×3): qty 1
  Filled 2024-01-14: qty 14

## 2024-01-14 MED ORDER — HALOPERIDOL LACTATE 5 MG/ML IJ SOLN: 5.0000 mg | Freq: Three times a day (TID) | INTRAMUSCULAR | Status: DC | PRN

## 2024-01-14 MED ORDER — ADULT MULTIVITAMIN W/MINERALS CH
1.0000 | ORAL_TABLET | Freq: Every day | ORAL | Status: DC
  Administered 2024-01-15 – 2024-01-17 (×3): 1 via ORAL
  Filled 2024-01-14 (×4): qty 1

## 2024-01-14 MED ORDER — BUPROPION HCL ER (XL) 150 MG PO TB24
150.0000 mg | ORAL_TABLET | Freq: Every morning | ORAL | Status: DC
  Administered 2024-01-15 – 2024-01-17 (×3): 150 mg via ORAL
  Filled 2024-01-14 (×2): qty 1
  Filled 2024-01-14: qty 14
  Filled 2024-01-14: qty 1

## 2024-01-14 MED ORDER — LEVOTHYROXINE SODIUM 75 MCG PO TABS
150.0000 ug | ORAL_TABLET | Freq: Every day | ORAL | Status: DC
  Administered 2024-01-14 – 2024-01-17 (×4): 150 ug via ORAL
  Filled 2024-01-14 (×4): qty 2
  Filled 2024-01-14: qty 28

## 2024-01-14 MED ORDER — CHLORDIAZEPOXIDE HCL 25 MG PO CAPS
25.0000 mg | ORAL_CAPSULE | Freq: Two times a day (BID) | ORAL | Status: AC
  Administered 2024-01-14 (×2): 25 mg via ORAL
  Filled 2024-01-14 (×2): qty 1

## 2024-01-14 MED ORDER — LOPERAMIDE HCL 2 MG PO CAPS: 2.0000 mg | ORAL_CAPSULE | ORAL | Status: AC | PRN

## 2024-01-14 MED ORDER — HALOPERIDOL LACTATE 5 MG/ML IJ SOLN: 10.0000 mg | Freq: Three times a day (TID) | INTRAMUSCULAR | Status: DC | PRN

## 2024-01-14 MED ORDER — CYCLOBENZAPRINE HCL 10 MG PO TABS
5.0000 mg | ORAL_TABLET | Freq: Three times a day (TID) | ORAL | Status: DC | PRN
  Administered 2024-01-14 – 2024-01-17 (×10): 5 mg via ORAL
  Filled 2024-01-14 (×6): qty 1
  Filled 2024-01-14: qty 10
  Filled 2024-01-14 (×5): qty 1

## 2024-01-14 MED ORDER — CHLORDIAZEPOXIDE HCL 25 MG PO CAPS: 25.0000 mg | ORAL_CAPSULE | Freq: Once | ORAL | Status: DC

## 2024-01-14 MED ORDER — ACETAMINOPHEN 325 MG PO TABS
650.0000 mg | ORAL_TABLET | Freq: Four times a day (QID) | ORAL | Status: DC | PRN
  Administered 2024-01-16 – 2024-01-17 (×2): 650 mg via ORAL
  Filled 2024-01-14 (×2): qty 2

## 2024-01-14 MED ORDER — MAGNESIUM HYDROXIDE 400 MG/5ML PO SUSP
30.0000 mL | Freq: Every day | ORAL | Status: DC | PRN
  Administered 2024-01-15 – 2024-01-17 (×2): 30 mL via ORAL
  Filled 2024-01-14 (×2): qty 30

## 2024-01-14 MED ORDER — ESTRADIOL 0.5 MG PO TABS
1.0000 mg | ORAL_TABLET | Freq: Every day | ORAL | Status: DC
  Administered 2024-01-15 – 2024-01-17 (×3): 1 mg via ORAL
  Filled 2024-01-14: qty 28
  Filled 2024-01-14 (×3): qty 2

## 2024-01-14 NOTE — ED Notes (Signed)
 Pt c/o trouble sleeping and anxiety. Atarax  25 and Trazodone  50 given as per orders. She is currently safe on the unit laying down eyes closed with unlabored breaths. Facility protocol safety checks in place.

## 2024-01-14 NOTE — ED Notes (Signed)
 Pt observed interaction with peers.  Behaviors appropriate.  Librium  taper continues and is also monitored via CIWA protocol.   Denied current withdrawal symptoms Q 15 minute observations for safety continue

## 2024-01-14 NOTE — Group Note (Signed)
 Group Topic: Healthy Self Image and Positive Change  Group Date: 01/14/2024 Start Time: 0320 End Time: 0420 Facilitators: [place] Stephane HERO, [place]  Department: [facility]  Number of Participants: 7  Group Focus: affirmation, clarity of thought, daily focus, and feeling awareness/expression Treatment Modality:  Psychoeducation Interventions utilized were exploration, mental fitness, patient education, reminiscence, and support Purpose: explore maladaptive thinking, express feelings, and improve communication skills  Name: Jamie Johnson Date of Birth: September 26, 1965  MR: [id number]    Level of Participation: active Quality of Participation: attention seeking, attentive, cooperative, engaged, initiates communication, motivated, and offered feedback Interactions with others: gave feedback Mood/Affect: appropriate, bright, brightens with interaction, and positive Triggers (if applicable): n/a Cognition: coherent/clear, concrete, goal directed, insightful, and logical Progress: Significant Response: n/a Plan: patient will be encouraged to continue to reframe themes of loss into gains  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   Recovering alcoholic in remission (HCC) 06/29/2023   Essential (primary) hypertension 06/29/2023   Hormone replacement therapy (postmenopausal) 06/29/2023   Rib fracture 03/06/2023   Chest pain 11/20/2022   Vitamin D insufficiency 10/24/2022   ACE-inhibitor cough 04/28/2022   Menopausal hot flushes 04/28/2022   Essential hypertension 12/10/2021    Postoperative pain 08/12/2019   Postmenopausal 01/24/2015   Allergic rhinitis 01/16/2015   Anxiety 01/16/2015   H/O alcohol abuse 01/16/2015   H/O drug abuse (HCC) 01/16/2015   Cannot sleep 01/16/2015   Thyroid  nodule 01/16/2015   Compulsive tobacco user syndrome 01/16/2015   Bradycardia, sinus 01/16/2015   Low back pain 01/16/2015   Hypothyroidism 12/01/2014

## 2024-01-14 NOTE — Progress Notes (Signed)
 Report called to Reyno, RN [facility].

## 2024-01-14 NOTE — Discharge Planning (Signed)
 LCSW met with patient to assess current mood, affect, physical state, and inquire about needs/goals while here in [facility] and after discharge. Patient reports he presented due to alcohol abuse and seeking detox. Patient reports she lost her employment in October and was recently evicted from her home last week. She stated she also is coping increased physical pain due to back issues with DJD and is in process of a disability claim. Patient stated she had begun drinking more over this past year with significant increase in the past few weeks with drinking 1/2 fifth of tequila or bourbon daily. Patient stated she had a 6 1/2 year period of sobriety but denies any  past residential treatment.  Patient is followed by her PCP, Kip Corrington at [facility] office for psychotropics.  Patient reports his/her current goal is to seek residential placement for substance use. Patient denies any prior history of outpatient or inpatient substance abuse treatment.  Patient currently denies any SI/HI/AVH. Patient aware that LCSW will send referrals out for review and will follow up to provide updates as received. Patient expressed understanding and appreciation of LCSW assistance.  Per Rosaline at [facility] patient has been accepted to program and can admit on Monday 8/4 @ 9:00.  No other needs were reported at this time by patient.

## 2024-01-14 NOTE — ED Notes (Signed)
 Pt arrived to [facility] room 168 from OBS for etoh detox.  Pt is calm and cooperative.  Denied SI HI and A/V hallucinations.  Pt is on Librium  taper.  Denied withdrawal symptoms at present, Q 15 minute observations for safety in place

## 2024-01-14 NOTE — ED Provider Notes (Signed)
 FBC/OBS ASAP Discharge Summary  Date and Time: 01/14/2024 1:17 PM  Name: Jamie Johnson  MRN:  [id number]   Discharge Diagnoses:  Final diagnoses:  Alcohol abuse  Moderate episode of recurrent major depressive disorder (HCC)  Homelessness    Subjective: No acute events overnight. Felt some tremulousness this AM. Noted subjective change in temperature. No serious alcohol withdrawal symptoms. Denied SI, HI, AVH. Amenable to [facility] transfer. Hypertensive to 158/104, no new symptoms. Will monitor on FBC.   Stay Summary: Admitted to obs 7/29 for alcohol detox and begun on 4-day librium  taper. [facility]BC bed secured 7/31. Will present to [facility] residential substance use treatment program Monday 8/4, accepted. NAEON. No serious symptoms of withdrawal elicited. Home PRN flexeril  for back pain. Denied SI, HI and AVH throughout.   Total Time spent with patient: 1 hour  Past Psychiatric History: Major depressive disorder versus substance-induced mood disorder, alcohol use disorder, severe, recurrent, in current episode Past Medical History: Patient has degenerative disk disease in upper C-spine, which she is in the process of receiving disability for.  Hypothyroidism (patient has an elevated TSH).  Currently going through menopause, prescribed estrogen. Family Psychiatric  History: Alcoholism in father Social History: Drinks 0.5 gallons of liquor every other day.   Current Medications:  No current facility-administered medications for this encounter.   Current Outpatient Medications  Medication Sig Dispense Refill   buPROPion  (WELLBUTRIN  XL) 150 MG 24 hr tablet Take 150 mg by mouth every morning.     Cholecalciferol (VITAMIN D3) 25 MCG (1000 UT) CAPS Take 1,000 Units by mouth daily.     cyclobenzaprine  (FLEXERIL ) 5 MG tablet Take 1 tablet (5 mg total) by mouth 3 (three) times daily as needed (neck pain). 12 tablet 0   escitalopram  (LEXAPRO ) 10 MG tablet Take 10 mg by mouth daily.     estradiol  (ESTRACE ) 1 MG  tablet Take 1 mg by mouth daily.     levothyroxine  (SYNTHROID ) 150 MCG tablet Take 150 mcg by mouth at bedtime.     ondansetron  (ZOFRAN -ODT) 4 MG disintegrating tablet Take 1 tablet (4 mg total) by mouth every 8 (eight) hours as needed for nausea or vomiting. 20 tablet 0   potassium chloride  (KLOR-CON  M) 10 MEQ tablet Take 10 mEq by mouth daily.     traMADol (ULTRAM) 50 MG tablet Take 50 mg by mouth every 6 (six) hours as needed (For pain). (Patient not taking: Reported on 01/13/2024)     Facility-Administered Medications Ordered in Other Encounters  Medication Dose Route Frequency Provider Last Rate Last Admin   acetaminophen  (TYLENOL ) tablet 650 mg  650 mg Oral Q6H PRN Rollene Katz, MD       alum & mag hydroxide-simeth (MAALOX/MYLANTA) 200-200-20 MG/5ML suspension 30 mL  30 mL Oral Q4H PRN Rollene Katz, MD       NOREEN ON 01/15/2024] buPROPion  (WELLBUTRIN  XL) 24 hr tablet 150 mg  150 mg Oral q morning Rollene Katz, MD       chlordiazePOXIDE  (LIBRIUM ) capsule 25 mg  25 mg Oral Q6H PRN Rollene Katz, MD       chlordiazePOXIDE  (LIBRIUM ) capsule 25 mg  25 mg Oral BID Rollene Katz, MD       Followed by   NOREEN ON 01/15/2024] chlordiazePOXIDE  (LIBRIUM ) capsule 25 mg  25 mg Oral BID Rollene Katz, MD       Followed by   NOREEN ON 01/16/2024] chlordiazePOXIDE  (LIBRIUM ) capsule 25 mg  25 mg Oral Once Rollene Katz, MD  cyclobenzaprine  (FLEXERIL ) tablet 5 mg  5 mg Oral TID PRN Rollene Katz, MD       haloperidol  (HALDOL ) tablet 5 mg  5 mg Oral TID PRN Rollene Katz, MD       And   diphenhydrAMINE  (BENADRYL ) capsule 50 mg  50 mg Oral TID PRN Rollene Katz, MD       haloperidol  lactate (HALDOL ) injection 5 mg  5 mg Intramuscular TID PRN Rollene Katz, MD       And   diphenhydrAMINE  (BENADRYL ) injection 50 mg  50 mg Intramuscular TID PRN Rollene Katz, MD       And   LORazepam  (ATIVAN ) injection 2 mg  2 mg Intramuscular TID PRN Rollene Katz, MD       haloperidol  lactate (HALDOL ) injection 10 mg  10 mg Intramuscular TID PRN Rollene Katz, MD       And   diphenhydrAMINE  (BENADRYL ) injection 50 mg  50 mg Intramuscular TID PRN Rollene Katz, MD       And   LORazepam  (ATIVAN ) injection 2 mg  2 mg Intramuscular TID PRN Rollene Katz, MD       NOREEN ON 01/15/2024] estradiol  (ESTRACE ) tablet 1 mg  1 mg Oral Daily Rollene Katz, MD       hydrOXYzine  (ATARAX ) tablet 25 mg  25 mg Oral TID PRN Rollene Katz, MD       levothyroxine  (SYNTHROID ) tablet 150 mcg  150 mcg Oral QHS Rollene Katz, MD       loperamide  (IMODIUM ) capsule 2-4 mg  2-4 mg Oral PRN Rollene Katz, MD       magnesium  hydroxide (MILK OF MAGNESIA) suspension 30 mL  30 mL Oral Daily PRN Rollene Katz, MD       multivitamin with minerals tablet 1 tablet  1 tablet Oral Daily Rollene Katz, MD       [START ON 01/15/2024] nicotine  (NICODERM CQ  - dosed in mg/24 hours) patch 14 mg  14 mg Transdermal Q0600 Rollene Katz, MD       ondansetron  (ZOFRAN -ODT) disintegrating tablet 4 mg  4 mg Oral Q6H PRN Rollene Katz, MD       NOREEN ON 01/15/2024] thiamine  (VITAMIN B1) tablet 100 mg  100 mg Oral Daily Rollene Katz, MD        PTA Medications:  Facility Ordered Medications  Medication   [COMPLETED] thiamine  (VITAMIN B1) injection 100 mg   [COMPLETED] chlordiazePOXIDE  (LIBRIUM ) capsule 25 mg   PTA Medications  Medication Sig   escitalopram  (LEXAPRO ) 10 MG tablet Take 10 mg by mouth daily.   buPROPion  (WELLBUTRIN  XL) 150 MG 24 hr tablet Take 150 mg by mouth every morning.   estradiol  (ESTRACE ) 1 MG tablet Take 1 mg by mouth daily.   levothyroxine  (SYNTHROID ) 150 MCG tablet Take 150 mcg by mouth at bedtime.   ondansetron  (ZOFRAN -ODT) 4 MG disintegrating tablet Take 1 tablet (4 mg total) by mouth every 8 (eight) hours as needed for nausea or vomiting.   Cholecalciferol (VITAMIN D3) 25 MCG (1000 UT) CAPS Take 1,000 Units by  mouth daily.   cyclobenzaprine  (FLEXERIL ) 5 MG tablet Take 1 tablet (5 mg total) by mouth 3 (three) times daily as needed (neck pain).   potassium chloride  (KLOR-CON  M) 10 MEQ tablet Take 10 mEq by mouth daily.   traMADol (ULTRAM) 50 MG tablet Take 50 mg by mouth every 6 (six) hours as needed (For pain). (Patient not taking: Reported on 01/13/2024)        No data to  display          Flowsheet Row ED from 01/14/2024 in [facility] ED from 01/12/2024 in [facility] ED from 12/10/2023 in [facility] Department at [facility]  C-SSRS RISK CATEGORY No Risk High Risk No Risk    Musculoskeletal  Strength & Muscle Tone: within normal limits Gait & Station: normal Patient leans: N/A  Psychiatric Specialty Exam  Presentation  General Appearance:  Appropriate for Environment  Eye Contact: Good  Speech: Clear and Coherent  Speech Volume: Normal  Handedness: Right   Mood and Affect  Mood: Euthymic  Affect: Congruent   Thought Process  Thought Processes: Coherent; Goal Directed; Linear  Descriptions of Associations:Intact  Orientation:Full (Time, Place and Person)  Thought Content:WDL  Diagnosis of Schizophrenia or Schizoaffective disorder in past: No    Hallucinations:Hallucinations: None  Ideas of Reference:None  Suicidal Thoughts:Suicidal Thoughts: No  Homicidal Thoughts:Homicidal Thoughts: No   Sensorium  Memory: Immediate Good; Recent Good; Remote Good  Judgment: Fair  Insight: Fair   Chartered certified accountant: Fair  Attention Span: Fair  Recall: [employer] of Knowledge: Fair  Language: Fair   Psychomotor Activity  Psychomotor Activity: Psychomotor Activity: Normal   Assets  Assets: Communication Skills; Desire for Improvement   Sleep  Sleep: Sleep: Fair  Safety Checks Durations: No data on file for Sleeping  No data  recorded  Physical Exam  Physical Exam Vitals reviewed.  Constitutional:      General: She is not in acute distress.    Appearance: She is not ill-appearing.  Neurological:     Mental Status: She is alert.    ROS Blood pressure (!) 158/104, pulse 73, temperature 97.6 F (36.4 C), temperature source Oral, resp. rate 16, SpO2 98%. There is no height or weight on file to calculate BMI.  Demographic Factors:  Caucasian, Low socioeconomic status, and Unemployed  Loss Factors: Decrease in vocational status  Historical Factors: Impulsivity  Risk Reduction Factors:   NA  Continued Clinical Symptoms:  Depression:   Comorbid alcohol abuse/dependence Impulsivity Alcohol/Substance Abuse/Dependencies  Cognitive Features That Contribute To Risk:  None    Suicide Risk:  Minimal: No identifiable suicidal ideation.  Patients presenting with few risk factors, may be classified as minimal risk based on the severity of the depressive symptoms.  Plan Of Care/Follow-up recommendations:  Follow-up recommendations:  Activity:  Normal, as tolerated Diet:  Per PCP recommendation  Patient is instructed prior to discharge to: Take all medications as prescribed by her mental healthcare provider. Report any adverse effects and/or reactions from the medicines to her outpatient provider promptly. Patient has been instructed & cautioned: To not engage in alcohol and or illegal drug use while on prescription medicines.  In the event of worsening symptoms, patient is instructed to call the crisis hotline at 988, 911 and or go to the nearest ED for appropriate evaluation and treatment of symptoms. To follow-up with her primary care provider for your other medical issues, concerns and or health care needs.   Disposition: FBC  Abdelaziz Westenberger, MD 01/14/2024, 1:17 PM

## 2024-01-14 NOTE — Group Note (Signed)
 Group Topic: Fears and Unhealthy Coping Skills  Group Date: 01/14/2024 Start Time: 1000 End Time: 1145 Facilitators: Lonzell Dwayne RAMAN, NT  Department: [employer]  Number of Participants: 4  Group Focus: chemical dependency issues Treatment Modality:  Patient-Centered Therapy and Spiritual Interventions utilized were story telling Purpose: regain self-worth  Name: Jamie Johnson Date of Birth: [birth year]  MR: [id number]    Level of Participation: Patient was not here for group Quality of Participation: N/A Interactions with others: N/A Mood/Affect: N/A Triggers (if applicable): N/A Cognition: N/A Progress: N/A Response: N/A Plan: N/A  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   Recovering alcoholic in remission (HCC) 06/29/2023   Essential (primary) hypertension 06/29/2023   Hormone replacement therapy (postmenopausal) 06/29/2023   Rib fracture 03/06/2023   Chest pain 11/20/2022   Vitamin D insufficiency 10/24/2022   ACE-inhibitor cough 04/28/2022   Menopausal hot flushes 04/28/2022   Essential hypertension 12/10/2021   Postoperative pain 08/12/2019   Postmenopausal 01/24/2015   Allergic rhinitis 01/16/2015   Anxiety 01/16/2015   H/O alcohol abuse 01/16/2015   H/O drug abuse (HCC) 01/16/2015   Cannot sleep 01/16/2015   Thyroid  nodule 01/16/2015   Compulsive tobacco user syndrome 01/16/2015   Bradycardia, sinus 01/16/2015   Low back pain 01/16/2015   Hypothyroidism 12/01/2014

## 2024-01-14 NOTE — ED Notes (Addendum)
 Patient is in the bedroom sleeping with eyes clothes. NAD.  Environment secured per policy. Will monitor for safety

## 2024-01-14 NOTE — Group Note (Signed)
 Group Topic: Recovery Basics  Group Date: 01/14/2024 Start Time: 2000 End Time: 2100 Facilitators: Anice Benton LABOR, NT  Department: [facility]  Number of Participants: 6  Group Focus: substance abuse education(AA Group) Treatment Modality:  Patient-Centered Therapy Interventions utilized were group exercise Purpose: relapse prevention strategies  Name: Jamie Johnson Date of Birth: 07-04-1965  MR: [id number]    Level of Participation: active Quality of Participation: cooperative Interactions with others: gave feedback Mood/Affect: appropriate Triggers (if applicable): None Cognition: coherent/clear Progress: Moderate Response: Good Plan: follow-up needed  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   Recovering alcoholic in remission (HCC) 06/29/2023   Essential (primary) hypertension 06/29/2023   Hormone replacement therapy (postmenopausal) 06/29/2023   Rib fracture 03/06/2023   Chest pain 11/20/2022   Vitamin D insufficiency 10/24/2022   ACE-inhibitor cough 04/28/2022   Menopausal hot flushes 04/28/2022   Essential hypertension 12/10/2021   Postoperative pain 08/12/2019   Postmenopausal 01/24/2015   Allergic rhinitis 01/16/2015   Anxiety 01/16/2015   H/O alcohol abuse 01/16/2015   H/O drug abuse (HCC) 01/16/2015   Cannot sleep 01/16/2015   Thyroid  nodule 01/16/2015   Compulsive tobacco user syndrome 01/16/2015   Bradycardia, sinus 01/16/2015   Low back pain 01/16/2015   Hypothyroidism 12/01/2014

## 2024-01-14 NOTE — Progress Notes (Signed)
Pt is awake, alert and oriented X4. Pt did not voice any complaints of pain or discomfort. No signs of acute distress noted. Administered scheduled meds per order. Pt denies current SI/HI/AVH, plan or intent. Staff will monitor for pt's safety.

## 2024-01-14 NOTE — ED Provider Notes (Addendum)
 Facility Based Crisis Admission H&P  Date: 01/14/24 Patient Name: Jamie Johnson MRN: [id number] Chief Complaint: I need help to stop drinking.  Diagnoses:  Final diagnoses:  Alcohol dependence with uncomplicated withdrawal (HCC)  Severe episode of recurrent major depressive disorder, without psychotic features (HCC)  GAD (generalized anxiety disorder)    HPI:  58 year old female with psychiatric history of alcohol abuse, drug abuse, MDD, and GAD, who presented voluntarily as a walk-in to [facility] with complaints of worsening depression and seeking substance/alcohol abuse treatment.  She is also interested in long-term residential treatment. Admitted to obs 7/29 for alcohol detox and begun on 4-day librium  taper. [facility] bed secured 7/31. Will present to [facility] residential substance use treatment program Monday 8/4, accepted. NAEON. No serious symptoms of withdrawal elicited. Home PRN flexeril  for back pain.   Per [facility] evaluation 2 days ago: Patient reports I binge drink and it's getting progressively worse after I lost my job in October. I was evicted this week, I'm on permanent disability but no payment yet, things are rough, all my family is deceased except for my brother who lives in [place]  but he works up here every other week.  In [place], I'm having hot flashes, cold sweats, shakes and weakness and I have no permanent transport because my car got repoed. Patient reports she drinks 1-2 beers, liquor (1/5 or 1/2 a gallon of tequila or [place]  bourbon daily, last drink yesterday, off/on binge drinking since October, worse last two weeks.  Patient denies history of seizures.Patient reports the whole world is crashing around me, my biological father was originally alcoholic.  He is now deceased.  Patient denies SI but states she usually has thoughts wondering why she is still here, it because she feels useless.  Patient reports she is prescribed antidepressants Lexapro , and Wellbutrin .   Patient reports she is also prescribed Flexeril , estrogen, levothyroxine .Patient denies illicit substance use.  She reports being currently homeless.  She denies access to gun.  She reports attempting suicide 3 months ago after she was evicted by her then boyfriend ans she took a butter knife to try to slash her arm, but that didn't do nothing. Patient endorses a history of substance abuse treatment/rehab seven years ago for a 3-day detox stay in [place].  She is endorsing poor sleep.    Patient seen at bedside and confirms above history noting multiple psychosocial stressors including unemployment, being evicted the past week and drinking alcohol heavily. Patient is on a librium  taper and reports only mild tremor this morning CIWA of 3 at this time.  Denies history of seizure or DT. Denies history of mania or psychosis. Patient notes feeling depressed and hopeless recently for the past 3 months especially and states she had a suicide attempt with a kinfe in her hand after she was evicted however did not actually cut herself or go to the hospital. Denied SI, HI, AVH at this time. Patient reports benefit from Lexapro  and Wellbutrin  and would like to continue these medications. Denies nightmares. Reports poor sleep, poor appetite. Reports anhedonia.  Psych Review of Symptoms:  ADHD: Patient denied any symptoms.     Anxiety:  Generalized Anxiety Symptoms: Difficulty controlling worry, excessive worry, difficulty with concentration, fatigues easily and sleep disturbances due to anxiety.  Additional Anxiety Symptoms: No panic attacks.     Depressive Symptoms:  Depressed mood, decreased interest, fatigue, feelings of worthlessness, withdrawal/isolation, irritable, psychomotor retardation, hopelessness, guilt and insomnia.    Disruptive and Conduct Symptoms: Patient denied  any symptoms.     Manic Symptoms: Patient denied any symptoms.  No decreased need for sleep, no distinct period of  increased energy/activity, not distractible, no grandiosity, no inflated self-esteem and no distinct period of elevated mood.   Obsessive-Compulsive Symptoms: Patient denied any symptoms.     Psychotic Symptoms: Patient denied any symptoms.  No delusions, no disorganized speech, no ideas of reference and no paranoia.    Trauma Related Symptoms: Patient denied any symptoms.   No avoidance of associated memories, thoughts, or feelings, no dissociative reactions/flashbacks, no exaggerated startle response and no hypervigilance.         PHQ 2-9:   Flowsheet Row ED from 01/14/2024 in [facility] ED from 01/12/2024 in [facility] ED from 12/10/2023 in [facility] Emergency Department at [facility]  C-SSRS RISK CATEGORY No Risk High Risk No Risk    Screenings    Flowsheet Row Most Recent Value  CIWA-Ar Total 0    Total Time spent with patient: 45 minutes  Musculoskeletal  Strength & Muscle Tone: within normal limits Gait & Station: normal Patient leans: N/A  Psychiatric Specialty Exam  Presentation General Appearance:  Appropriate for Environment  Eye Contact: Good  Speech: Clear and Coherent  Speech Volume: Normal  Handedness: Right   Mood and Affect  Mood: dysphoric  Affect: Congruent   Thought Process  Thought Processes: Coherent; Goal Directed; Linear  Descriptions of Associations:Intact  Orientation:Full (Time, Place and Person)  Thought Content:WDL  Diagnosis of Schizophrenia or Schizoaffective disorder in past: No   Hallucinations:Hallucinations: None  Ideas of Reference:None  Suicidal Thoughts:Suicidal Thoughts: No  Homicidal Thoughts:Homicidal Thoughts: No   Sensorium  Memory: Immediate Good; Recent Good; Remote Good  Judgment: Fair  Insight: Fair   Chartered certified accountant: Fair  Attention Span: Fair  Recall: [employer] of  Knowledge: Fair  Language: Fair   Psychomotor Activity  Psychomotor Activity: Psychomotor Activity: Normal   Assets  Assets: Communication Skills; Desire for Improvement   Sleep  Sleep: Sleep: Fair   No data recorded  Physical exam:  General: Well developed, well nourished, Caucasian female Pupils: Normal at 3mm Respiratory: Breathing is unlabored.  Cardiovascular: No edema.  Language: No anomia, no aphasia Muscle strength and tone-pt moving all extremities.  Gait not assessed as pt remained in bed.  Neuro: Facial muscles are symmetric. Pt without tremor, no evidence of hyperarousal.   Review of Systems  Constitutional:  Positive for fatigue. Negative for chills and fever.  HENT: Negative.    Eyes: Negative.   Respiratory: Negative.    Cardiovascular: Negative.   Gastrointestinal: Negative.   Genitourinary: Negative.   Musculoskeletal: Negative.   Skin: Negative.   Neurological:  Positive for difficulty with concentration. Negative for dizziness, tingling and headaches.  Endo/Heme/Allergies: Negative.   Psychiatric/Behavioral:  Positive for depression. Negative for paranoia. The patient is nervous/anxious and has insomnia.     Blood pressure 133/64, pulse 89, temperature 98.6 F (37 C), temperature source Oral, resp. rate 18, SpO2 98%. There is no height or weight on file to calculate BMI.  Past Psychiatric History:   Major depressive disorder versus substance-induced mood disorder, alcohol use disorder, severe, recurrent, in current episode   Is the patient at risk to self? No  Has the patient been a risk to self in the past 6 months? No .    Has the patient been a risk to self within the distant past? No   Is  the patient a risk to others? No   Has the patient been a risk to others in the past 6 months? No   Has the patient been a risk to others within the distant past? 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  Hypothyroidism (patient has an elevated TSH).  Currently going through menopause, prescribed estrogen. Family Psychiatric  History: Alcoholism in father Social History: Drinks 0.5 gallons of liquor every other day.    Last Labs:  Admission on 01/12/2024, Discharged on 01/14/2024  Component Date Value Ref Range Status   WBC 01/12/2024 10.4  4.0 - 10.5 K/uL Final   RBC 01/12/2024 4.32  3.87 - 5.11 MIL/uL Final   Hemoglobin 01/12/2024 14.6  12.0 - 15.0 g/dL Final   HCT 92/70/7974 42.4  36.0 - 46.0 % Final   MCV 01/12/2024 98.1  80.0 - 100.0 fL Final   MCH 01/12/2024 33.8  26.0 - 34.0 pg Final   MCHC 01/12/2024 34.4  30.0 - 36.0 g/dL Final   RDW 92/70/7974 14.2  11.5 - 15.5 % Final   Platelets 01/12/2024 259  150 - 400 K/uL Final   nRBC 01/12/2024 0.0  0.0 - 0.2 % Final   Neutrophils Relative % 01/12/2024 72  % Final   Neutro Abs 01/12/2024 7.5  1.7 - 7.7 K/uL Final   Lymphocytes Relative 01/12/2024 20  % Final   Lymphs Abs 01/12/2024 2.0  0.7 - 4.0 K/uL Final   Monocytes Relative 01/12/2024 7  % Final   Monocytes Absolute 01/12/2024 0.7  0.1 - 1.0 K/uL Final   Eosinophils Relative 01/12/2024 0  % Final   Eosinophils Absolute 01/12/2024 0.0  0.0 - 0.5 K/uL Final   Basophils Relative 01/12/2024 1  % Final   Basophils Absolute 01/12/2024 0.1  0.0 - 0.1 K/uL Final   Immature Granulocytes 01/12/2024 0  % Final   Abs Immature Granulocytes 01/12/2024 0.03  0.00 - 0.07 K/uL Final   Performed at [facility], [address], [place], [place] [address]   Sodium 01/12/2024 136  135 - 145 mmol/L Final   Potassium 01/12/2024 3.5  3.5 - 5.1 mmol/L Final   Chloride 01/12/2024 98  98 - 111 mmol/L Final   CO2 01/12/2024 26  22 - 32 mmol/L Final   Glucose, Bld 01/12/2024 96  70 - 99 mg/dL Final   Glucose reference range applies only to samples taken after fasting for at least 8 hours.   BUN 01/12/2024 9  6 - 20 mg/dL Final   Creatinine,  Ser 01/12/2024 0.99  0.44 - 1.00 mg/dL Final   Calcium 92/70/7974 9.7  8.9 - 10.3 mg/dL Final   Total Protein 92/70/7974 6.7  6.5 - 8.1 g/dL Final   Albumin 92/70/7974 4.2  3.5 - 5.0 g/dL Final   AST 92/70/7974 43 (H)  15 - 41 U/L Final   ALT 01/12/2024 25  0 - 44 U/L Final   Alkaline Phosphatase 01/12/2024 69  38 - 126 U/L Final   Total Bilirubin 01/12/2024 1.2  0.0 - 1.2 mg/dL Final   GFR, Estimated 01/12/2024 >60  >60 mL/min Final   Comment: (NOTE) Calculated using the CKD-EPI Creatinine Equation (2021)    Anion gap 01/12/2024 12  5 - 15 Final   Performed at [facility], [address], [place], [place] [address]   Hgb A1c MFr Bld 01/12/2024 4.6 (L)  4.8 - 5.6 % Final   Comment: (NOTE) Diagnosis of Diabetes The following HbA1c ranges recommended by the American Diabetes Association (ADA) may be used as an aid in the diagnosis of diabetes mellitus.  Hemoglobin             Suggested A1C NGSP%              Diagnosis  <5.7                   Non Diabetic  5.7-6.4                Pre-Diabetic  >6.4                   Diabetic  <7.0                   Glycemic control for                       adults with diabetes.     Mean Plasma Glucose 01/12/2024 85.32  mg/dL Final   Performed at [facility], [address] [place], [place] [address]   Alcohol, Ethyl (B) 01/12/2024 <15  <15 mg/dL Final   Comment: (NOTE) For medical purposes only. Performed at [facility], [address] [place], [place] [address]    Cholesterol 01/12/2024 235 (H)  0 - 200 mg/dL Final   Triglycerides 92/70/7974 151 (H)  <150 mg/dL Final   HDL 92/70/7974 >135  >40 mg/dL Final   Total CHOL/HDL Ratio 01/12/2024 NOT CALCULATED  RATIO Final   VLDL 01/12/2024 30  0 - 40 mg/dL Final   LDL Cholesterol 01/12/2024 NOT CALCULATED  0 - 99 mg/dL Final   Performed at [facility], [address], [place], [place] [address]   TSH 01/12/2024 43.386 (H)  0.350 - 4.500 uIU/mL Final   Comment:  Performed by a 3rd Generation assay with a functional sensitivity of <=0.01 uIU/mL. Performed at [facility], [address], [place], [place] [address]    Preg Test, Ur 01/12/2024 Negative  Negative Final   POC Amphetamine UR 01/12/2024 None Detected  NONE DETECTED (Cut Off Level 1000 ng/mL) Final   POC Secobarbital (BAR) 01/12/2024 None Detected  NONE DETECTED (Cut Off Level 300 ng/mL) Final   POC Buprenorphine (BUP) 01/12/2024 None Detected  NONE DETECTED (Cut Off Level 10 ng/mL) Final   POC Oxazepam (BZO) 01/12/2024 None Detected  NONE DETECTED (Cut Off Level 300 ng/mL) Final   POC Cocaine UR 01/12/2024 None Detected  NONE DETECTED (Cut Off Level 300 ng/mL) Final   POC Methamphetamine UR 01/12/2024 None Detected  NONE DETECTED (Cut Off Level 1000 ng/mL) Final   POC Morphine  01/12/2024 None Detected  NONE DETECTED (Cut Off Level 300 ng/mL) Final   POC Methadone UR 01/12/2024 None Detected  NONE DETECTED (Cut Off Level 300 ng/mL) Final   POC Oxycodone  UR 01/12/2024 None Detected  NONE DETECTED (Cut Off Level 100 ng/mL) Final   POC Marijuana UR 01/12/2024 None Detected  NONE DETECTED (Cut Off Level 50 ng/mL) Final  Admission on 12/10/2023, Discharged on 12/10/2023  Component Date Value Ref Range Status   Sodium 12/10/2023 147 (H)  135 - 145 mmol/L Final   Potassium 12/10/2023 4.1  3.5 - 5.1 mmol/L Final   Chloride 12/10/2023 109  98 - 111 mmol/L Final   CO2 12/10/2023 24  22 - 32 mmol/L Final   Glucose,  Bld 12/10/2023 83  70 - 99 mg/dL Final   Glucose reference range applies only to samples taken after fasting for at least 8 hours.   BUN 12/10/2023 14  6 - 20 mg/dL Final   Creatinine, Ser 12/10/2023 0.87  0.44 - 1.00 mg/dL Final   Calcium 93/73/7974 8.9  8.9 - 10.3 mg/dL Final   Total Protein 93/73/7974 7.2  6.5 - 8.1 g/dL Final   Albumin 93/73/7974 4.3  3.5 - 5.0 g/dL Final   AST 93/73/7974 84 (H)  15 - 41 U/L Final   ALT 12/10/2023 42  0 - 44 U/L Final   Alkaline Phosphatase  12/10/2023 60  38 - 126 U/L Final   Total Bilirubin 12/10/2023 0.8  0.0 - 1.2 mg/dL Final   GFR, Estimated 12/10/2023 >60  >60 mL/min Final   Comment: (NOTE) Calculated using the CKD-EPI Creatinine Equation (2021)    Anion gap 12/10/2023 14  5 - 15 Final   Performed at [facility], [address] [place], [place] [address]   Alcohol, Ethyl (B) 12/10/2023 363 (HH)  <15 mg/dL Final   Comment: CRITICAL RESULT CALLED TO, READ BACK BY AND VERIFIED WITH MAGGIE BARBER @1810  12/10/23 MJU (NOTE) For medical purposes only. Performed at [facility], [address] [place], [place] [address]    WBC 12/10/2023 7.5  4.0 - 10.5 K/uL Final   RBC 12/10/2023 4.01  3.87 - 5.11 MIL/uL Final   Hemoglobin 12/10/2023 13.4  12.0 - 15.0 g/dL Final   HCT 93/73/7974 39.7  36.0 - 46.0 % Final   MCV 12/10/2023 99.0  80.0 - 100.0 fL Final   MCH 12/10/2023 33.4  26.0 - 34.0 pg Final   MCHC 12/10/2023 33.8  30.0 - 36.0 g/dL Final   RDW 93/73/7974 14.0  11.5 - 15.5 % Final   Platelets 12/10/2023 244  150 - 400 K/uL Final   nRBC 12/10/2023 0.0  0.0 - 0.2 % Final   Performed at [facility], [address] [place], [place] [address]   Tricyclic, Ur Screen 12/10/2023 NONE DETECTED  NONE DETECTED Final   Amphetamines, Ur Screen 12/10/2023 NONE DETECTED  NONE DETECTED Final   MDMA (Ecstasy)Ur Screen 12/10/2023 NONE DETECTED  NONE DETECTED Final   Cocaine Metabolite,Ur [place] 12/10/2023 NONE DETECTED  NONE DETECTED Final   Opiate, Ur Screen 12/10/2023 NONE DETECTED  NONE DETECTED Final   Phencyclidine (PCP) Ur S 12/10/2023 NONE DETECTED  NONE DETECTED Final   Cannabinoid 50 Ng, Ur [place] 12/10/2023 NONE DETECTED  NONE DETECTED Final   Barbiturates, Ur Screen 12/10/2023 NONE DETECTED  NONE DETECTED Final   Benzodiazepine, Ur Scrn 12/10/2023 NONE DETECTED  NONE DETECTED Final   Methadone Scn, Ur 12/10/2023 NONE DETECTED  NONE DETECTED Final   Comment: (NOTE) Tricyclics + metabolites, urine     Cutoff 1000 ng/mL Amphetamines + metabolites, urine  Cutoff 1000 ng/mL MDMA (Ecstasy), urine              Cutoff 500 ng/mL Cocaine Metabolite, urine          Cutoff 300 ng/mL Opiate + metabolites, urine        Cutoff 300 ng/mL Phencyclidine (PCP), urine         Cutoff 25 ng/mL Cannabinoid, urine                 Cutoff 50 ng/mL Barbiturates + metabolites, urine  Cutoff 200 ng/mL Benzodiazepine, urine  Cutoff 200 ng/mL Methadone, urine                   Cutoff 300 ng/mL  The urine drug screen provides only a preliminary, unconfirmed analytical test result and should not be used for non-medical purposes. Clinical consideration and professional judgment should be applied to any positive drug screen result due to possible interfering substances. A more specific alternate chemical method must be used in order to obtain a confirmed analytical result. Gas chromatography / mass spectrometry (GC/MS) is the preferred confirm                          atory method. Performed at [facility], [address] [place], [place] [address]     Allergies: Pseudoephedrine  Medications:  Facility Ordered Medications  Medication   [COMPLETED] chlordiazePOXIDE  (LIBRIUM ) capsule 25 mg   [START ON 01/15/2024] buPROPion  (WELLBUTRIN  XL) 24 hr tablet 150 mg   cyclobenzaprine  (FLEXERIL ) tablet 5 mg   [START ON 01/15/2024] estradiol  (ESTRACE ) tablet 1 mg   levothyroxine  (SYNTHROID ) tablet 150 mcg   multivitamin with minerals tablet 1 tablet   chlordiazePOXIDE  (LIBRIUM ) capsule 25 mg   loperamide  (IMODIUM ) capsule 2-4 mg   ondansetron  (ZOFRAN -ODT) disintegrating tablet 4 mg   [START ON 01/15/2024] thiamine  (VITAMIN B1) tablet 100 mg   chlordiazePOXIDE  (LIBRIUM ) capsule 25 mg   Followed by   NOREEN ON 01/15/2024] chlordiazePOXIDE  (LIBRIUM ) capsule 25 mg   Followed by   NOREEN ON 01/16/2024] chlordiazePOXIDE  (LIBRIUM ) capsule 25 mg   acetaminophen  (TYLENOL ) tablet 650 mg   alum & mag  hydroxide-simeth (MAALOX/MYLANTA) 200-200-20 MG/5ML suspension 30 mL   magnesium  hydroxide (MILK OF MAGNESIA) suspension 30 mL   haloperidol  (HALDOL ) tablet 5 mg   And   diphenhydrAMINE  (BENADRYL ) capsule 50 mg   haloperidol  lactate (HALDOL ) injection 5 mg   And   diphenhydrAMINE  (BENADRYL ) injection 50 mg   And   LORazepam  (ATIVAN ) injection 2 mg   haloperidol  lactate (HALDOL ) injection 10 mg   And   diphenhydrAMINE  (BENADRYL ) injection 50 mg   And   LORazepam  (ATIVAN ) injection 2 mg   hydrOXYzine  (ATARAX ) tablet 25 mg   [START ON 01/15/2024] nicotine  (NICODERM CQ  - dosed in mg/24 hours) patch 14 mg   PTA Medications  Medication Sig   escitalopram  (LEXAPRO ) 10 MG tablet Take 10 mg by mouth daily.   buPROPion  (WELLBUTRIN  XL) 150 MG 24 hr tablet Take 150 mg by mouth every morning.   estradiol  (ESTRACE ) 1 MG tablet Take 1 mg by mouth daily.   levothyroxine  (SYNTHROID ) 150 MCG tablet Take 150 mcg by mouth at bedtime.   ondansetron  (ZOFRAN -ODT) 4 MG disintegrating tablet Take 1 tablet (4 mg total) by mouth every 8 (eight) hours as needed for nausea or vomiting.   Cholecalciferol (VITAMIN D3) 25 MCG (1000 UT) CAPS Take 1,000 Units by mouth daily.   cyclobenzaprine  (FLEXERIL ) 5 MG tablet Take 1 tablet (5 mg total) by mouth 3 (three) times daily as needed (neck pain).   traMADol (ULTRAM) 50 MG tablet Take 50 mg by mouth every 6 (six) hours as needed (For pain). (Patient not taking: Reported on 01/13/2024)   potassium chloride  (KLOR-CON  M) 10 MEQ tablet Take 10 mEq by mouth daily.    Long Term Goals: Improvement in symptoms so as ready for discharge  Short Term Goals: Patient will verbalize feelings in meetings with treatment team members., Patient will attend at least of 50% of the  groups daily., Pt will complete the PHQ9 on admission, day 3 and discharge., Patient will participate in completing the [place] Suicide Severity Rating Scale, Patient will score a low risk of violence for 24 hours  prior to discharge, and Patient will take medications as prescribed daily.  Medical Decision Making  Patient has decision making capacity  58 year old female with psychiatric history of alcohol abuse, drug abuse, MDD, and GAD, who presented voluntarily as a walk-in to [facility] with complaints of worsening depression and seeking substance/alcohol abuse treatment.  She is also interested in long-term residential treatment.   7/31: patient is tolerating librium  taper and is future oriented on sobriety. Denies SI Or HI. Reports history of symptoms consistent with MDE however at this time with large alcohol induced component. She is requesting detox and inpatient rehab placement. Will consider starting naltrexone as patient completes librium  taper. She reports benefit from Lexapro  and Wellbutrin  and declines medication changes.  #alcohol use disorder severe with withdrawal -continue Librium  taper + CIWA protocol -patient requesting gabapentin  trial for anxiety, neuropathy and withdrawal, discussed r/b/a of starting 100 mg TID and she is agreeable Denies history of seizure or Dts however with significant withdrawal symptoms on admission  #MDD,severe recurrent without psychotic features -continue Lexapro  10 mg qdaily and Wellbutrin   XL 150 mg qdaily   #hypothyrodism Restarted levothyroxine  Recommendations  Based on my evaluation the patient does not appear to have an emergency medical condition.  Patrizia Paule, MD 01/14/24  4:13 PM

## 2024-01-15 DIAGNOSIS — F332 Major depressive disorder, recurrent severe without psychotic features: Secondary | ICD-10-CM | POA: Diagnosis not present

## 2024-01-15 DIAGNOSIS — F10230 Alcohol dependence with withdrawal, uncomplicated: Secondary | ICD-10-CM | POA: Diagnosis not present

## 2024-01-15 DIAGNOSIS — G4700 Insomnia, unspecified: Secondary | ICD-10-CM | POA: Diagnosis not present

## 2024-01-15 DIAGNOSIS — F411 Generalized anxiety disorder: Secondary | ICD-10-CM | POA: Diagnosis not present

## 2024-01-15 MED ORDER — NICOTINE 14 MG/24HR TD PT24: 14.0000 mg | MEDICATED_PATCH | Freq: Every day | TRANSDERMAL | 0 refills | Status: AC

## 2024-01-15 MED ORDER — HYDROXYZINE HCL 25 MG PO TABS: 25.0000 mg | ORAL_TABLET | Freq: Three times a day (TID) | ORAL | 0 refills | Status: AC | PRN

## 2024-01-15 MED ORDER — ESTRADIOL 1 MG PO TABS: 1.0000 mg | ORAL_TABLET | Freq: Every day | ORAL | 0 refills | Status: AC

## 2024-01-15 MED ORDER — BUPROPION HCL ER (XL) 150 MG PO TB24: 150.0000 mg | ORAL_TABLET | Freq: Every morning | ORAL | 0 refills | Status: AC

## 2024-01-15 MED ORDER — LEVOTHYROXINE SODIUM 150 MCG PO TABS: 150.0000 ug | ORAL_TABLET | Freq: Every day | ORAL | 0 refills | Status: DC

## 2024-01-15 MED ORDER — ESCITALOPRAM OXALATE 10 MG PO TABS: 10.0000 mg | ORAL_TABLET | Freq: Every day | ORAL | 0 refills | Status: AC

## 2024-01-15 MED ORDER — CYCLOBENZAPRINE HCL 5 MG PO TABS: 5.0000 mg | ORAL_TABLET | Freq: Three times a day (TID) | ORAL | 0 refills | Status: AC | PRN

## 2024-01-15 NOTE — Group Note (Signed)
 Group Topic: Understanding Self  Group Date: 01/15/2024 Start Time: 1930 End Time: 2000 Facilitators: Anice Benton LABOR, NT  Department: [facility]  Number of Participants: 10  Group Focus: clarity of thought Treatment Modality:  Cognitive Behavioral Therapy Interventions utilized were assignment (Thankful Worksheet) Purpose: explore maladaptive thinking  Name: Jamie Johnson Date of Birth: [birth year]  MR: [id number]    Level of Participation: active Quality of Participation: attentive Interactions with others: gave feedback Mood/Affect: appropriate Triggers (if applicable): None Cognition: coherent/clear Progress: Moderate Response: Good Plan: follow-up needed  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   Recovering alcoholic in remission (HCC) 06/29/2023   Essential (primary) hypertension 06/29/2023   Hormone replacement therapy (postmenopausal) 06/29/2023   Rib fracture 03/06/2023   Chest pain 11/20/2022   Vitamin D insufficiency 10/24/2022   ACE-inhibitor cough 04/28/2022   Menopausal hot flushes 04/28/2022   Essential hypertension 12/10/2021   Postoperative pain 08/12/2019   Postmenopausal 01/24/2015   Allergic rhinitis 01/16/2015   Anxiety 01/16/2015   H/O alcohol abuse 01/16/2015   H/O drug abuse (HCC) 01/16/2015   Cannot sleep 01/16/2015   Thyroid  nodule 01/16/2015   Compulsive tobacco user syndrome 01/16/2015   Bradycardia, sinus 01/16/2015   Low back pain 01/16/2015   Hypothyroidism 12/01/2014

## 2024-01-15 NOTE — ED Provider Notes (Signed)
 Behavioral Health Progress Note  Date and Time: 01/15/2024 6:10 PM Name: Jamie Johnson MRN:  [id number] ID: 58 year old female with psychiatric history of alcohol abuse, drug abuse, MDD, and GAD, who presented voluntarily as a walk-in to [facility] with complaints of worsening depression and seeking substance/alcohol abuse treatment.   Subjective:  Patient reports improving mood, sleep, and appetite. Denies wanting to be dead. Denies SI, HI or AVH. She is requesting a gabapentin  trial for anxiety and neuropathy. Patient is attending groups and journaling. States her goals are to quit drinking and to quit smoking. Patient denies feeling hopeless and is future oriented on inpatient rehab placement. Denies nightmares or recurrent memories. She is not responding to internal stimuli. States she feels better.  Diagnosis:  Final diagnoses:  Alcohol dependence with uncomplicated withdrawal (HCC)  Severe episode of recurrent major depressive disorder, without psychotic features (HCC)  GAD (generalized anxiety disorder)    Total Time spent with patient: 30 minutes  Past Psychiatric History:  Major depressive disorder versus substance-induced mood disorder, alcohol use disorder, severe, recurrent, in current episode  Suicidal gesture with butter knife 3 months ago but never actually cut herself or went to the hospital, occurred after boyfriend kicked her out  Past Medical History: Patient has degenerative disk disease in upper C-spine, which she is in the process of receiving disability for.  Hypothyroidism (patient has an elevated TSH).  Currently going through menopause, prescribed estrogen. Family Psychiatric  History: Alcoholism in father Social History: Drinks 0.5 gallons of liquor every other day.    Additional Social History:                         Sleep: Fair  Appetite:  Fair  Current Medications:  Current Facility-Administered Medications  Medication Dose Route Frequency  Provider Last Rate Last Admin   acetaminophen  (TYLENOL ) tablet 650 mg  650 mg Oral Q6H PRN Rollene Katz, MD       alum & mag hydroxide-simeth (MAALOX/MYLANTA) 200-200-20 MG/5ML suspension 30 mL  30 mL Oral Q4H PRN Rollene Katz, MD       buPROPion  (WELLBUTRIN  XL) 24 hr tablet 150 mg  150 mg Oral q morning Rollene Katz, MD   150 mg at 01/15/24 1006   chlordiazePOXIDE  (LIBRIUM ) capsule 25 mg  25 mg Oral Q6H PRN Rollene Katz, MD       chlordiazePOXIDE  (LIBRIUM ) capsule 25 mg  25 mg Oral BID Rollene Katz, MD   25 mg at 01/15/24 1006   Followed by   NOREEN ON 01/16/2024] chlordiazePOXIDE  (LIBRIUM ) capsule 25 mg  25 mg Oral Once Rollene Katz, MD       cyclobenzaprine  (FLEXERIL ) tablet 5 mg  5 mg Oral TID PRN Rollene Katz, MD   5 mg at 01/15/24 1612   haloperidol  (HALDOL ) tablet 5 mg  5 mg Oral TID PRN Rollene Katz, MD       And   diphenhydrAMINE  (BENADRYL ) capsule 50 mg  50 mg Oral TID PRN Rollene Katz, MD       haloperidol  lactate (HALDOL ) injection 5 mg  5 mg Intramuscular TID PRN Rollene Katz, MD       And   diphenhydrAMINE  (BENADRYL ) injection 50 mg  50 mg Intramuscular TID PRN Rollene Katz, MD       And   LORazepam  (ATIVAN ) injection 2 mg  2 mg Intramuscular TID PRN Rollene Katz, MD       haloperidol  lactate (HALDOL ) injection 10 mg  10  mg Intramuscular TID PRN Rollene Katz, MD       And   diphenhydrAMINE  (BENADRYL ) injection 50 mg  50 mg Intramuscular TID PRN Rollene Katz, MD       And   LORazepam  (ATIVAN ) injection 2 mg  2 mg Intramuscular TID PRN Rollene Katz, MD       escitalopram  (LEXAPRO ) tablet 10 mg  10 mg Oral Daily Minas Bonser, MD   10 mg at 01/15/24 1006   estradiol  (ESTRACE ) tablet 1 mg  1 mg Oral Daily Rollene Katz, MD   1 mg at 01/15/24 1006   gabapentin  (NEURONTIN ) capsule 100 mg  100 mg Oral TID Antavious Spanos, MD   100 mg at 01/15/24 1612   hydrOXYzine  (ATARAX ) tablet 25 mg  25 mg  Oral TID PRN Rollene Katz, MD   25 mg at 01/14/24 2115   levothyroxine  (SYNTHROID ) tablet 150 mcg  150 mcg Oral QHS Crawford, Benjamin, MD   150 mcg at 01/14/24 2115   loperamide  (IMODIUM ) capsule 2-4 mg  2-4 mg Oral PRN Rollene Katz, MD       magnesium  hydroxide (MILK OF MAGNESIA) suspension 30 mL  30 mL Oral Daily PRN Rollene Katz, MD       multivitamin with minerals tablet 1 tablet  1 tablet Oral Daily Rollene Katz, MD   1 tablet at 01/15/24 1006   nicotine  (NICODERM CQ  - dosed in mg/24 hours) patch 14 mg  14 mg Transdermal Q0600 Rollene Katz, MD   14 mg at 01/15/24 1010   ondansetron  (ZOFRAN -ODT) disintegrating tablet 4 mg  4 mg Oral Q6H PRN Rollene Katz, MD       thiamine  (VITAMIN B1) tablet 100 mg  100 mg Oral Daily Rollene Katz, MD   100 mg at 01/15/24 1006   Current Outpatient Medications  Medication Sig Dispense Refill   buPROPion  (WELLBUTRIN  XL) 150 MG 24 hr tablet Take 1 tablet (150 mg total) by mouth every morning. 30 tablet 0   Cholecalciferol (VITAMIN D3) 25 MCG (1000 UT) CAPS Take 1,000 Units by mouth daily.     cyclobenzaprine  (FLEXERIL ) 5 MG tablet Take 1 tablet (5 mg total) by mouth 3 (three) times daily as needed (neck pain). 90 tablet 0   escitalopram  (LEXAPRO ) 10 MG tablet Take 1 tablet (10 mg total) by mouth daily. 30 tablet 0   estradiol  (ESTRACE ) 1 MG tablet Take 1 tablet (1 mg total) by mouth daily. 30 tablet 0   hydrOXYzine  (ATARAX ) 25 MG tablet Take 1 tablet (25 mg total) by mouth 3 (three) times daily as needed for anxiety. 60 tablet 0   levothyroxine  (SYNTHROID ) 150 MCG tablet Take 1 tablet (150 mcg total) by mouth at bedtime. 30 tablet 0   [START ON 01/16/2024] nicotine  (NICODERM CQ  - DOSED IN MG/24 HOURS) 14 mg/24hr patch Place 1 patch (14 mg total) onto the skin daily at 6 (six) AM. 30 patch 0   ondansetron  (ZOFRAN -ODT) 4 MG disintegrating tablet Take 1 tablet (4 mg total) by mouth every 8 (eight) hours as needed for nausea or  vomiting. 20 tablet 0   potassium chloride  (KLOR-CON  M) 10 MEQ tablet Take 10 mEq by mouth daily.      Labs  Lab Results:  Admission on 01/12/2024, Discharged on 01/14/2024  Component Date Value Ref Range Status   WBC 01/12/2024 10.4  4.0 - 10.5 K/uL Final   RBC 01/12/2024 4.32  3.87 - 5.11 MIL/uL Final   Hemoglobin 01/12/2024 14.6  12.0 - 15.0 g/dL  Final   HCT 01/12/2024 42.4  36.0 - 46.0 % Final   MCV 01/12/2024 98.1  80.0 - 100.0 fL Final   MCH 01/12/2024 33.8  26.0 - 34.0 pg Final   MCHC 01/12/2024 34.4  30.0 - 36.0 g/dL Final   RDW 92/70/7974 14.2  11.5 - 15.5 % Final   Platelets 01/12/2024 259  150 - 400 K/uL Final   nRBC 01/12/2024 0.0  0.0 - 0.2 % Final   Neutrophils Relative % 01/12/2024 72  % Final   Neutro Abs 01/12/2024 7.5  1.7 - 7.7 K/uL Final   Lymphocytes Relative 01/12/2024 20  % Final   Lymphs Abs 01/12/2024 2.0  0.7 - 4.0 K/uL Final   Monocytes Relative 01/12/2024 7  % Final   Monocytes Absolute 01/12/2024 0.7  0.1 - 1.0 K/uL Final   Eosinophils Relative 01/12/2024 0  % Final   Eosinophils Absolute 01/12/2024 0.0  0.0 - 0.5 K/uL Final   Basophils Relative 01/12/2024 1  % Final   Basophils Absolute 01/12/2024 0.1  0.0 - 0.1 K/uL Final   Immature Granulocytes 01/12/2024 0  % Final   Abs Immature Granulocytes 01/12/2024 0.03  0.00 - 0.07 K/uL Final   Performed at [facility], [address], [place], [place] [address]   Sodium 01/12/2024 136  135 - 145 mmol/L Final   Potassium 01/12/2024 3.5  3.5 - 5.1 mmol/L Final   Chloride 01/12/2024 98  98 - 111 mmol/L Final   CO2 01/12/2024 26  22 - 32 mmol/L Final   Glucose, Bld 01/12/2024 96  70 - 99 mg/dL Final   Glucose reference range applies only to samples taken after fasting for at least 8 hours.   BUN 01/12/2024 9  6 - 20 mg/dL Final   Creatinine, Ser 01/12/2024 0.99  0.44 - 1.00 mg/dL Final   Calcium 92/70/7974 9.7  8.9 - 10.3 mg/dL Final   Total Protein 92/70/7974 6.7  6.5 - 8.1 g/dL Final   Albumin  92/70/7974 4.2  3.5 - 5.0 g/dL Final   AST 92/70/7974 43 (H)  15 - 41 U/L Final   ALT 01/12/2024 25  0 - 44 U/L Final   Alkaline Phosphatase 01/12/2024 69  38 - 126 U/L Final   Total Bilirubin 01/12/2024 1.2  0.0 - 1.2 mg/dL Final   GFR, Estimated 01/12/2024 >60  >60 mL/min Final   Comment: (NOTE) Calculated using the CKD-EPI Creatinine Equation (2021)    Anion gap 01/12/2024 12  5 - 15 Final   Performed at [facility], [address], [place], [place] [address]   Hgb A1c MFr Bld 01/12/2024 4.6 (L)  4.8 - 5.6 % Final   Comment: (NOTE) Diagnosis of Diabetes The following HbA1c ranges recommended by the American Diabetes Association (ADA) may be used as an aid in the diagnosis of diabetes mellitus.  Hemoglobin             Suggested A1C NGSP%              Diagnosis  <5.7                   Non Diabetic  5.7-6.4                Pre-Diabetic  >6.4                   Diabetic  <7.0  Glycemic control for                       adults with diabetes.     Mean Plasma Glucose 01/12/2024 85.32  mg/dL Final   Performed at [facility], [address], [place], [place] [address]   Alcohol, Ethyl (B) 01/12/2024 <15  <15 mg/dL Final   Comment: (NOTE) For medical purposes only. Performed at [facility], [address] [place], [place] [address]    Cholesterol 01/12/2024 235 (H)  0 - 200 mg/dL Final   Triglycerides 92/70/7974 151 (H)  <150 mg/dL Final   HDL 92/70/7974 >135  >40 mg/dL Final   Total CHOL/HDL Ratio 01/12/2024 NOT CALCULATED  RATIO Final   VLDL 01/12/2024 30  0 - 40 mg/dL Final   LDL Cholesterol 01/12/2024 NOT CALCULATED  0 - 99 mg/dL Final   Performed at [facility], [address] [place], [place] [address]   TSH 01/12/2024 43.386 (H)  0.350 - 4.500 uIU/mL Final   Comment: Performed by a 3rd Generation assay with a functional sensitivity of <=0.01 uIU/mL. Performed at [facility], [address], [place], [place]  [address]    Preg Test, Ur 01/12/2024 Negative  Negative Final   POC Amphetamine UR 01/12/2024 None Detected  NONE DETECTED (Cut Off Level 1000 ng/mL) Final   POC Secobarbital (BAR) 01/12/2024 None Detected  NONE DETECTED (Cut Off Level 300 ng/mL) Final   POC Buprenorphine (BUP) 01/12/2024 None Detected  NONE DETECTED (Cut Off Level 10 ng/mL) Final   POC Oxazepam (BZO) 01/12/2024 None Detected  NONE DETECTED (Cut Off Level 300 ng/mL) Final   POC Cocaine UR 01/12/2024 None Detected  NONE DETECTED (Cut Off Level 300 ng/mL) Final   POC Methamphetamine UR 01/12/2024 None Detected  NONE DETECTED (Cut Off Level 1000 ng/mL) Final   POC Morphine  01/12/2024 None Detected  NONE DETECTED (Cut Off Level 300 ng/mL) Final   POC Methadone UR 01/12/2024 None Detected  NONE DETECTED (Cut Off Level 300 ng/mL) Final   POC Oxycodone  UR 01/12/2024 None Detected  NONE DETECTED (Cut Off Level 100 ng/mL) Final   POC Marijuana UR 01/12/2024 None Detected  NONE DETECTED (Cut Off Level 50 ng/mL) Final  Admission on 12/10/2023, Discharged on 12/10/2023  Component Date Value Ref Range Status   Sodium 12/10/2023 147 (H)  135 - 145 mmol/L Final   Potassium 12/10/2023 4.1  3.5 - 5.1 mmol/L Final   Chloride 12/10/2023 109  98 - 111 mmol/L Final   CO2 12/10/2023 24  22 - 32 mmol/L Final   Glucose, Bld 12/10/2023 83  70 - 99 mg/dL Final   Glucose reference range applies only to samples taken after fasting for at least 8 hours.   BUN 12/10/2023 14  6 - 20 mg/dL Final   Creatinine, Ser 12/10/2023 0.87  0.44 - 1.00 mg/dL Final   Calcium 93/73/7974 8.9  8.9 - 10.3 mg/dL Final   Total Protein 93/73/7974 7.2  6.5 - 8.1 g/dL Final   Albumin 93/73/7974 4.3  3.5 - 5.0 g/dL Final   AST 93/73/7974 84 (H)  15 - 41 U/L Final   ALT 12/10/2023 42  0 - 44 U/L Final   Alkaline Phosphatase 12/10/2023 60  38 - 126 U/L Final   Total Bilirubin 12/10/2023 0.8  0.0 - 1.2 mg/dL Final   GFR, Estimated 12/10/2023 >60  >60 mL/min Final   Comment:  (NOTE) Calculated using  the CKD-EPI Creatinine Equation (2021)    Anion gap 12/10/2023 14  5 - 15 Final   Performed at [facility], [address] [place], [place] [address]   Alcohol, Ethyl (B) 12/10/2023 363 (HH)  <15 mg/dL Final   Comment: CRITICAL RESULT CALLED TO, READ BACK BY AND VERIFIED WITH MAGGIE BARBER @1810  12/10/23 MJU (NOTE) For medical purposes only. Performed at [facility], [address] [place], [place] [address]    WBC 12/10/2023 7.5  4.0 - 10.5 K/uL Final   RBC 12/10/2023 4.01  3.87 - 5.11 MIL/uL Final   Hemoglobin 12/10/2023 13.4  12.0 - 15.0 g/dL Final   HCT 93/73/7974 39.7  36.0 - 46.0 % Final   MCV 12/10/2023 99.0  80.0 - 100.0 fL Final   MCH 12/10/2023 33.4  26.0 - 34.0 pg Final   MCHC 12/10/2023 33.8  30.0 - 36.0 g/dL Final   RDW 93/73/7974 14.0  11.5 - 15.5 % Final   Platelets 12/10/2023 244  150 - 400 K/uL Final   nRBC 12/10/2023 0.0  0.0 - 0.2 % Final   Performed at [facility], [address] [place], [place] [address]   Tricyclic, Ur Screen 12/10/2023 NONE DETECTED  NONE DETECTED Final   Amphetamines, Ur Screen 12/10/2023 NONE DETECTED  NONE DETECTED Final   MDMA (Ecstasy)Ur Screen 12/10/2023 NONE DETECTED  NONE DETECTED Final   Cocaine Metabolite,Ur [place] 12/10/2023 NONE DETECTED  NONE DETECTED Final   Opiate, Ur Screen 12/10/2023 NONE DETECTED  NONE DETECTED Final   Phencyclidine (PCP) Ur S 12/10/2023 NONE DETECTED  NONE DETECTED Final   Cannabinoid 50 Ng, Ur [place] 12/10/2023 NONE DETECTED  NONE DETECTED Final   Barbiturates, Ur Screen 12/10/2023 NONE DETECTED  NONE DETECTED Final   Benzodiazepine, Ur Scrn 12/10/2023 NONE DETECTED  NONE DETECTED Final   Methadone Scn, Ur 12/10/2023 NONE DETECTED  NONE DETECTED Final   Comment: (NOTE) Tricyclics + metabolites, urine    Cutoff 1000 ng/mL Amphetamines + metabolites, urine  Cutoff 1000 ng/mL MDMA (Ecstasy), urine              Cutoff 500 ng/mL Cocaine Metabolite, urine           Cutoff 300 ng/mL Opiate + metabolites, urine        Cutoff 300 ng/mL Phencyclidine (PCP), urine         Cutoff 25 ng/mL Cannabinoid, urine                 Cutoff 50 ng/mL Barbiturates + metabolites, urine  Cutoff 200 ng/mL Benzodiazepine, urine              Cutoff 200 ng/mL Methadone, urine                   Cutoff 300 ng/mL  The urine drug screen provides only a preliminary, unconfirmed analytical test result and should not be used for non-medical purposes. Clinical consideration and professional judgment should be applied to any positive drug screen result due to possible interfering substances. A more specific alternate chemical method must be used in order to obtain a confirmed analytical result. Gas chromatography / mass spectrometry (GC/MS) is the preferred confirm                          atory method. Performed at [facility], [address] [place], [place] [address]     Blood Alcohol level:  Lab Results  Component Value  Date   ETH <15 01/12/2024   ETH 363 (HH) 12/10/2023    Metabolic Disorder Labs: Lab Results  Component Value Date   HGBA1C 4.6 (L) 01/12/2024   MPG 85.32 01/12/2024   No results found for: PROLACTIN Lab Results  Component Value Date   CHOL 235 (H) 01/12/2024   TRIG 151 (H) 01/12/2024   HDL >135 01/12/2024   CHOLHDL NOT CALCULATED 01/12/2024   VLDL 30 01/12/2024   LDLCALC NOT CALCULATED 01/12/2024    Therapeutic Lab Levels: No results found for: LITHIUM No results found for: VALPROATE No results found for: CBMZ  Physical Findings   CAGE-AID    Flowsheet Row ED to Hosp-Admission (Discharged) from 03/06/2023 in [facility]  CAGE-AID Score 0   Flowsheet Row ED from 01/14/2024 in [facility] ED from 01/12/2024 in [facility] ED from 12/10/2023 in [facility] Emergency Department at [facility]  C-SSRS RISK CATEGORY  No Risk High Risk No Risk     Musculoskeletal  Strength & Muscle Tone: within normal limits Gait & Station: normal Patient leans: N/A  Psychiatric Specialty Exam  Presentation  General Appearance:  Appropriate for Environment  Eye Contact: Good  Speech: Clear and Coherent  Speech Volume: Normal  Handedness: Right   Mood and Affect  Mood: Euthymic  Affect: Congruent   Thought Process  Thought Processes: Coherent; Goal Directed; Linear  Descriptions of Associations:Intact  Orientation:Full (Time, Place and Person)  Thought Content:WDL  Diagnosis of Schizophrenia or Schizoaffective disorder in past: No    Hallucinations:Hallucinations: None  Ideas of Reference:None  Suicidal Thoughts:Suicidal Thoughts: No  Homicidal Thoughts:Homicidal Thoughts: No   Sensorium  Memory: Immediate Good; Recent Good; Remote Good  Judgment: Fair  Insight: Fair   Chartered certified accountant: Fair  Attention Span: Fair  Recall: [employer] of Knowledge: Fair  Language: Fair   Psychomotor Activity  Psychomotor Activity: Psychomotor Activity: Normal   Assets  Assets: Communication Skills; Desire for Improvement   Sleep  Sleep: Sleep: Fair  Estimated Sleeping Duration (Last 24 Hours): 11.00-12.00 hours  No data recorded  Physical Exam  Physical Exam ROS Blood pressure 139/86, pulse 68, temperature 97.8 F (36.6 C), temperature source Oral, resp. rate 17, SpO2 97%. There is no height or weight on file to calculate BMI.  Treatment Plan Summary: 59 year old female with psychiatric history of alcohol abuse, drug abuse, MDD, and GAD, who presented voluntarily as a walk-in to [facility] with complaints of worsening depression and seeking substance/alcohol abuse treatment.  She is also interested in long-term residential treatment.    7/31: patient is tolerating librium  taper and is future oriented on sobriety. Denies SI Or HI. Reports  history of symptoms consistent with MDE however at this time with large alcohol induced component. She is requesting detox and inpatient rehab placement. Will consider starting naltrexone as patient completes librium  taper. She reports benefit from Lexapro  and Wellbutrin  and declines medication changes.  8/1: patient euthymic and future oriented. Reports chronic neuropathy and anxiety and would like to complete gabapentin  trial. Denies SI, HI or AVH.   #alcohol use disorder severe with withdrawal -continue Librium  taper + CIWA protocol -patient requesting gabapentin  trial for anxiety, neuropathy and withdrawal, discussed r/b/a of starting 100 mg TID and she is agreeable Denies history of seizure or Dts however with significant withdrawal symptoms on admission   #MDD,severe recurrent without psychotic features -continue Lexapro  10 mg qdaily and Wellbutrin   XL 150 mg qdaily #hypothyrodism contlevothyroxine  Felix Meras, MD 01/15/2024 6:10 PM

## 2024-01-15 NOTE — ED Notes (Signed)
 Assumed care of patient this am, patient resting quietly in bed,  pleasant and cooperative upon approach, c/o feeling wobbly upon arising, affect flat mood depressed but,  denied self harm thoughts, plan or intentions. There was overt fine tremors of the hands and no other complaints.

## 2024-01-15 NOTE — ED Notes (Signed)
 CIWA assessment unable to complete at this time, pt is sleeping.

## 2024-01-15 NOTE — Discharge Planning (Signed)
 Patient will be discharging to [facility] residential treatment program on Monday 8/4 with cab to be arranged for pick up at 8:15 for 9AM arrival. Patient will need 14 days of medications with 30 day scripts. Cab voucher is in patients chart.

## 2024-01-15 NOTE — ED Notes (Signed)
 Patient asleep, NAD. will monitor for safety.

## 2024-01-15 NOTE — Discharge Instructions (Addendum)
  [facility] [address] [place], [place] [address] Admin Hours: Mon-Fri 8AM to [facility] Center Hours: 24/7  Phone: [phone] Fax: [phone]  Services Variable Length Residential Program Comprehensive Clinical Assessment Person Centered Planning and Individual Counseling Substance Abuse Education Sessions Discharge Planning/Aftercare Person Centered Plan

## 2024-01-16 DIAGNOSIS — F411 Generalized anxiety disorder: Secondary | ICD-10-CM | POA: Diagnosis not present

## 2024-01-16 DIAGNOSIS — F332 Major depressive disorder, recurrent severe without psychotic features: Secondary | ICD-10-CM | POA: Diagnosis not present

## 2024-01-16 DIAGNOSIS — F10230 Alcohol dependence with withdrawal, uncomplicated: Secondary | ICD-10-CM | POA: Diagnosis not present

## 2024-01-16 DIAGNOSIS — G4700 Insomnia, unspecified: Secondary | ICD-10-CM | POA: Diagnosis not present

## 2024-01-16 MED ORDER — GABAPENTIN 100 MG PO CAPS
200.0000 mg | ORAL_CAPSULE | Freq: Three times a day (TID) | ORAL | Status: DC
  Administered 2024-01-16 – 2024-01-17 (×4): 200 mg via ORAL
  Filled 2024-01-16: qty 84
  Filled 2024-01-16 (×4): qty 2

## 2024-01-16 NOTE — Group Note (Signed)
 Group Topic: Wellness  Group Date: 01/16/2024 Start Time: 0900 End Time: 1000 Facilitators: Winfred Byers, LPN; Herold Lajuana NOVAK, RN  Department: [facility]  Number of Participants: 9  Group Focus: nursing group Treatment Modality:  Psychoeducation Interventions utilized were clarification, patient education Purpose: increase insight  Name: Jamie Johnson Date of Birth: [birth year]  MR: [id number]    Level of Participation: active Quality of Participation: attentive and cooperative Interactions with others: gave feedback Mood/Affect: appropriate Triggers (if applicable): N/A Cognition: coherent/clear and insightful Progress: Gaining insight Response: Patient verbalized understanding. Plan: patient will be encouraged to verbally express any questions or concerns they have about their medication and report any side effects they experience after taking their medications.  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   Recovering alcoholic in remission (HCC) 06/29/2023   Essential (primary) hypertension 06/29/2023   Hormone replacement therapy (postmenopausal) 06/29/2023   Rib fracture 03/06/2023   Chest pain 11/20/2022   Vitamin D insufficiency 10/24/2022   ACE-inhibitor cough 04/28/2022   Menopausal hot flushes 04/28/2022   Essential hypertension 12/10/2021   Postoperative pain 08/12/2019   Postmenopausal 01/24/2015   Allergic rhinitis 01/16/2015   Anxiety 01/16/2015   H/O alcohol abuse 01/16/2015   H/O drug abuse (HCC) 01/16/2015   Cannot sleep 01/16/2015    Thyroid  nodule 01/16/2015   Compulsive tobacco user syndrome 01/16/2015   Bradycardia, sinus 01/16/2015   Low back pain 01/16/2015   Hypothyroidism 12/01/2014

## 2024-01-16 NOTE — ED Provider Notes (Signed)
 Behavioral Health Progress Note  Date and Time: 01/16/2024 5:09 PM Name: Jamie Johnson MRN:  [id number] ID: 58 year old female with psychiatric history of alcohol abuse, drug abuse, MDD, and GAD, who presented voluntarily as a walk-in to [facility] with complaints of worsening depression and seeking substance/alcohol abuse treatment.   Subjective:  Patient reports improving mood, sleep, and appetite. Reports continued neck and back pain but improving neuropathy and hands and feet. She would like to try a higher dose of gabapentin  and denies side effects. Denies wanting to be dead. Denies SI, HI or AVH.  Patient is attending groups and journaling. States her goals are to quit drinking and to quit smoking. Patient denies feeling hopeless and is future oriented on inpatient rehab placement. Denies nightmares or recurrent memories. She is not responding to internal stimuli.   Diagnosis:  Final diagnoses:  Alcohol dependence with uncomplicated withdrawal (HCC)  Severe episode of recurrent major depressive disorder, without psychotic features (HCC)  GAD (generalized anxiety disorder)    Total Time spent with patient: 30 minutes  Past Psychiatric History:  Major depressive disorder versus substance-induced mood disorder, alcohol use disorder, severe, recurrent, in current episode  Suicidal gesture with butter knife 3 months ago but never actually cut herself or went to the hospital, occurred after boyfriend kicked her out  Past Medical History: Patient has degenerative disk disease in upper C-spine, which she is in the process of receiving disability for.  Hypothyroidism (patient has an elevated TSH).  Currently going through menopause, prescribed estrogen. Family Psychiatric  History: Alcoholism in father Social History: Drinks 0.5 gallons of liquor every other day.    Additional Social History:                         Sleep: Fair  Appetite:  Fair  Current Medications:  Current  Facility-Administered Medications  Medication Dose Route Frequency Provider Last Rate Last Admin   acetaminophen  (TYLENOL ) tablet 650 mg  650 mg Oral Q6H PRN Rollene Katz, MD   650 mg at 01/16/24 1332   alum & mag hydroxide-simeth (MAALOX/MYLANTA) 200-200-20 MG/5ML suspension 30 mL  30 mL Oral Q4H PRN Rollene Katz, MD       buPROPion  (WELLBUTRIN  XL) 24 hr tablet 150 mg  150 mg Oral q morning Rollene Katz, MD   150 mg at 01/16/24 9086   chlordiazePOXIDE  (LIBRIUM ) capsule 25 mg  25 mg Oral Q6H PRN Rollene Katz, MD       cyclobenzaprine  (FLEXERIL ) tablet 5 mg  5 mg Oral TID PRN Rollene Katz, MD   5 mg at 01/16/24 9083   haloperidol  (HALDOL ) tablet 5 mg  5 mg Oral TID PRN Rollene Katz, MD       And   diphenhydrAMINE  (BENADRYL ) capsule 50 mg  50 mg Oral TID PRN Rollene Katz, MD       haloperidol  lactate (HALDOL ) injection 5 mg  5 mg Intramuscular TID PRN Rollene Katz, MD       And   diphenhydrAMINE  (BENADRYL ) injection 50 mg  50 mg Intramuscular TID PRN Rollene Katz, MD       And   LORazepam  (ATIVAN ) injection 2 mg  2 mg Intramuscular TID PRN Rollene Katz, MD       haloperidol  lactate (HALDOL ) injection 10 mg  10 mg Intramuscular TID PRN Rollene Katz, MD       And   diphenhydrAMINE  (BENADRYL ) injection 50 mg  50 mg Intramuscular TID PRN Rollene Katz, MD  And   LORazepam  (ATIVAN ) injection 2 mg  2 mg Intramuscular TID PRN Rollene Katz, MD       escitalopram  (LEXAPRO ) tablet 10 mg  10 mg Oral Daily Pratyush Ammon, MD   10 mg at 01/16/24 9085   estradiol  (ESTRACE ) tablet 1 mg  1 mg Oral Daily Rollene Katz, MD   1 mg at 01/16/24 9086   gabapentin  (NEURONTIN ) capsule 200 mg  200 mg Oral TID Yennifer Segovia, MD       hydrOXYzine  (ATARAX ) tablet 25 mg  25 mg Oral TID PRN Rollene Katz, MD   25 mg at 01/15/24 2113   levothyroxine  (SYNTHROID ) tablet 150 mcg  150 mcg Oral QHS Crawford, Benjamin, MD   150 mcg at  01/15/24 2113   loperamide  (IMODIUM ) capsule 2-4 mg  2-4 mg Oral PRN Rollene Katz, MD       magnesium  hydroxide (MILK OF MAGNESIA) suspension 30 mL  30 mL Oral Daily PRN Rollene Katz, MD   30 mL at 01/15/24 1954   multivitamin with minerals tablet 1 tablet  1 tablet Oral Daily Rollene Katz, MD   1 tablet at 01/16/24 9086   nicotine  (NICODERM CQ  - dosed in mg/24 hours) patch 14 mg  14 mg Transdermal Q0600 Rollene Katz, MD   14 mg at 01/16/24 1016   ondansetron  (ZOFRAN -ODT) disintegrating tablet 4 mg  4 mg Oral Q6H PRN Rollene Katz, MD       thiamine  (VITAMIN B1) tablet 100 mg  100 mg Oral Daily Rollene Katz, MD   100 mg at 01/16/24 9085   Current Outpatient Medications  Medication Sig Dispense Refill   buPROPion  (WELLBUTRIN  XL) 150 MG 24 hr tablet Take 1 tablet (150 mg total) by mouth every morning. 30 tablet 0   Cholecalciferol (VITAMIN D3) 25 MCG (1000 UT) CAPS Take 1,000 Units by mouth daily.     cyclobenzaprine  (FLEXERIL ) 5 MG tablet Take 1 tablet (5 mg total) by mouth 3 (three) times daily as needed (neck pain). 90 tablet 0   escitalopram  (LEXAPRO ) 10 MG tablet Take 1 tablet (10 mg total) by mouth daily. 30 tablet 0   estradiol  (ESTRACE ) 1 MG tablet Take 1 tablet (1 mg total) by mouth daily. 30 tablet 0   hydrOXYzine  (ATARAX ) 25 MG tablet Take 1 tablet (25 mg total) by mouth 3 (three) times daily as needed for anxiety. 60 tablet 0   levothyroxine  (SYNTHROID ) 150 MCG tablet Take 1 tablet (150 mcg total) by mouth at bedtime. 30 tablet 0   nicotine  (NICODERM CQ  - DOSED IN MG/24 HOURS) 14 mg/24hr patch Place 1 patch (14 mg total) onto the skin daily at 6 (six) AM. 30 patch 0   ondansetron  (ZOFRAN -ODT) 4 MG disintegrating tablet Take 1 tablet (4 mg total) by mouth every 8 (eight) hours as needed for nausea or vomiting. 20 tablet 0   potassium chloride  (KLOR-CON  M) 10 MEQ tablet Take 10 mEq by mouth daily.      Labs  Lab Results:  Admission on 01/12/2024,  Discharged on 01/14/2024  Component Date Value Ref Range Status   WBC 01/12/2024 10.4  4.0 - 10.5 K/uL Final   RBC 01/12/2024 4.32  3.87 - 5.11 MIL/uL Final   Hemoglobin 01/12/2024 14.6  12.0 - 15.0 g/dL Final   HCT 92/70/7974 42.4  36.0 - 46.0 % Final   MCV 01/12/2024 98.1  80.0 - 100.0 fL Final   MCH 01/12/2024 33.8  26.0 - 34.0 pg Final   MCHC 01/12/2024 34.4  30.0 - 36.0 g/dL Final   RDW 92/70/7974 14.2  11.5 - 15.5 % Final   Platelets 01/12/2024 259  150 - 400 K/uL Final   nRBC 01/12/2024 0.0  0.0 - 0.2 % Final   Neutrophils Relative % 01/12/2024 72  % Final   Neutro Abs 01/12/2024 7.5  1.7 - 7.7 K/uL Final   Lymphocytes Relative 01/12/2024 20  % Final   Lymphs Abs 01/12/2024 2.0  0.7 - 4.0 K/uL Final   Monocytes Relative 01/12/2024 7  % Final   Monocytes Absolute 01/12/2024 0.7  0.1 - 1.0 K/uL Final   Eosinophils Relative 01/12/2024 0  % Final   Eosinophils Absolute 01/12/2024 0.0  0.0 - 0.5 K/uL Final   Basophils Relative 01/12/2024 1  % Final   Basophils Absolute 01/12/2024 0.1  0.0 - 0.1 K/uL Final   Immature Granulocytes 01/12/2024 0  % Final   Abs Immature Granulocytes 01/12/2024 0.03  0.00 - 0.07 K/uL Final   Performed at [facility], [address], [place], [place] [address]   Sodium 01/12/2024 136  135 - 145 mmol/L Final   Potassium 01/12/2024 3.5  3.5 - 5.1 mmol/L Final   Chloride 01/12/2024 98  98 - 111 mmol/L Final   CO2 01/12/2024 26  22 - 32 mmol/L Final   Glucose, Bld 01/12/2024 96  70 - 99 mg/dL Final   Glucose reference range applies only to samples taken after fasting for at least 8 hours.   BUN 01/12/2024 9  6 - 20 mg/dL Final   Creatinine, Ser 01/12/2024 0.99  0.44 - 1.00 mg/dL Final   Calcium 92/70/7974 9.7  8.9 - 10.3 mg/dL Final   Total Protein 92/70/7974 6.7  6.5 - 8.1 g/dL Final   Albumin 92/70/7974 4.2  3.5 - 5.0 g/dL Final   AST 92/70/7974 43 (H)  15 - 41 U/L Final   ALT 01/12/2024 25  0 - 44 U/L Final   Alkaline Phosphatase 01/12/2024 69   38 - 126 U/L Final   Total Bilirubin 01/12/2024 1.2  0.0 - 1.2 mg/dL Final   GFR, Estimated 01/12/2024 >60  >60 mL/min Final   Comment: (NOTE) Calculated using the CKD-EPI Creatinine Equation (2021)    Anion gap 01/12/2024 12  5 - 15 Final   Performed at [facility], [address], [place], [place] [address]   Hgb A1c MFr Bld 01/12/2024 4.6 (L)  4.8 - 5.6 % Final   Comment: (NOTE) Diagnosis of Diabetes The following HbA1c ranges recommended by the American Diabetes Association (ADA) may be used as an aid in the diagnosis of diabetes mellitus.  Hemoglobin             Suggested A1C NGSP%              Diagnosis  <5.7                   Non Diabetic  5.7-6.4                Pre-Diabetic  >6.4                   Diabetic  <7.0                   Glycemic control for                       adults with diabetes.     Mean Plasma Glucose 01/12/2024  85.32  mg/dL Final   Performed at [facility], [address] [place], [place] [address]   Alcohol, Ethyl (B) 01/12/2024 <15  <15 mg/dL Final   Comment: (NOTE) For medical purposes only. Performed at [facility], [address], [place], [place] [address]    Cholesterol 01/12/2024 235 (H)  0 - 200 mg/dL Final   Triglycerides 92/70/7974 151 (H)  <150 mg/dL Final   HDL 92/70/7974 >135  >40 mg/dL Final   Total CHOL/HDL Ratio 01/12/2024 NOT CALCULATED  RATIO Final   VLDL 01/12/2024 30  0 - 40 mg/dL Final   LDL Cholesterol 01/12/2024 NOT CALCULATED  0 - 99 mg/dL Final   Performed at [facility], [address], [place], [place] [address]   TSH 01/12/2024 43.386 (H)  0.350 - 4.500 uIU/mL Final   Comment: Performed by a 3rd Generation assay with a functional sensitivity of <=0.01 uIU/mL. Performed at [facility], [address] [place], [place] [address]    Preg Test, Ur 01/12/2024 Negative  Negative Final   POC Amphetamine UR 01/12/2024 None Detected  NONE DETECTED (Cut Off Level 1000 ng/mL) Final   POC  Secobarbital (BAR) 01/12/2024 None Detected  NONE DETECTED (Cut Off Level 300 ng/mL) Final   POC Buprenorphine (BUP) 01/12/2024 None Detected  NONE DETECTED (Cut Off Level 10 ng/mL) Final   POC Oxazepam (BZO) 01/12/2024 None Detected  NONE DETECTED (Cut Off Level 300 ng/mL) Final   POC Cocaine UR 01/12/2024 None Detected  NONE DETECTED (Cut Off Level 300 ng/mL) Final   POC Methamphetamine UR 01/12/2024 None Detected  NONE DETECTED (Cut Off Level 1000 ng/mL) Final   POC Morphine  01/12/2024 None Detected  NONE DETECTED (Cut Off Level 300 ng/mL) Final   POC Methadone UR 01/12/2024 None Detected  NONE DETECTED (Cut Off Level 300 ng/mL) Final   POC Oxycodone  UR 01/12/2024 None Detected  NONE DETECTED (Cut Off Level 100 ng/mL) Final   POC Marijuana UR 01/12/2024 None Detected  NONE DETECTED (Cut Off Level 50 ng/mL) Final  Admission on 12/10/2023, Discharged on 12/10/2023  Component Date Value Ref Range Status   Sodium 12/10/2023 147 (H)  135 - 145 mmol/L Final   Potassium 12/10/2023 4.1  3.5 - 5.1 mmol/L Final   Chloride 12/10/2023 109  98 - 111 mmol/L Final   CO2 12/10/2023 24  22 - 32 mmol/L Final   Glucose, Bld 12/10/2023 83  70 - 99 mg/dL Final   Glucose reference range applies only to samples taken after fasting for at least 8 hours.   BUN 12/10/2023 14  6 - 20 mg/dL Final   Creatinine, Ser 12/10/2023 0.87  0.44 - 1.00 mg/dL Final   Calcium 93/73/7974 8.9  8.9 - 10.3 mg/dL Final   Total Protein 93/73/7974 7.2  6.5 - 8.1 g/dL Final   Albumin 93/73/7974 4.3  3.5 - 5.0 g/dL Final   AST 93/73/7974 84 (H)  15 - 41 U/L Final   ALT 12/10/2023 42  0 - 44 U/L Final   Alkaline Phosphatase 12/10/2023 60  38 - 126 U/L Final   Total Bilirubin 12/10/2023 0.8  0.0 - 1.2 mg/dL Final   GFR, Estimated 12/10/2023 >60  >60 mL/min Final   Comment: (NOTE) Calculated using the CKD-EPI Creatinine Equation (2021)    Anion gap 12/10/2023 14  5 - 15 Final   Performed at [facility], [address] [place], [place] [address]   Alcohol, Ethyl (B)  12/10/2023 363 (HH)  <15 mg/dL Final   Comment: CRITICAL RESULT CALLED TO, READ BACK BY AND VERIFIED WITH MAGGIE BARBER @1810  12/10/23 MJU (NOTE) For medical purposes only. Performed at [facility], [address] [place], [place] [address]    WBC 12/10/2023 7.5  4.0 - 10.5 K/uL Final   RBC 12/10/2023 4.01  3.87 - 5.11 MIL/uL Final   Hemoglobin 12/10/2023 13.4  12.0 - 15.0 g/dL Final   HCT 93/73/7974 39.7  36.0 - 46.0 % Final   MCV 12/10/2023 99.0  80.0 - 100.0 fL Final   MCH 12/10/2023 33.4  26.0 - 34.0 pg Final   MCHC 12/10/2023 33.8  30.0 - 36.0 g/dL Final   RDW 93/73/7974 14.0  11.5 - 15.5 % Final   Platelets 12/10/2023 244  150 - 400 K/uL Final   nRBC 12/10/2023 0.0  0.0 - 0.2 % Final   Performed at [facility], [address] [place], [place] [address]   Tricyclic, Ur Screen 12/10/2023 NONE DETECTED  NONE DETECTED Final   Amphetamines, Ur Screen 12/10/2023 NONE DETECTED  NONE DETECTED Final   MDMA (Ecstasy)Ur Screen 12/10/2023 NONE DETECTED  NONE DETECTED Final   Cocaine Metabolite,Ur [place] 12/10/2023 NONE DETECTED  NONE DETECTED Final   Opiate, Ur Screen 12/10/2023 NONE DETECTED  NONE DETECTED Final   Phencyclidine (PCP) Ur S 12/10/2023 NONE DETECTED  NONE DETECTED Final   Cannabinoid 50 Ng, Ur [place] 12/10/2023 NONE DETECTED  NONE DETECTED Final   Barbiturates, Ur Screen 12/10/2023 NONE DETECTED  NONE DETECTED Final   Benzodiazepine, Ur Scrn 12/10/2023 NONE DETECTED  NONE DETECTED Final   Methadone Scn, Ur 12/10/2023 NONE DETECTED  NONE DETECTED Final   Comment: (NOTE) Tricyclics + metabolites, urine    Cutoff 1000 ng/mL Amphetamines + metabolites, urine  Cutoff 1000 ng/mL MDMA (Ecstasy), urine              Cutoff 500 ng/mL Cocaine Metabolite, urine          Cutoff 300 ng/mL Opiate + metabolites, urine        Cutoff 300 ng/mL Phencyclidine (PCP), urine         Cutoff 25 ng/mL Cannabinoid, urine                  Cutoff 50 ng/mL Barbiturates + metabolites, urine  Cutoff 200 ng/mL Benzodiazepine, urine              Cutoff 200 ng/mL Methadone, urine                   Cutoff 300 ng/mL  The urine drug screen provides only a preliminary, unconfirmed analytical test result and should not be used for non-medical purposes. Clinical consideration and professional judgment should be applied to any positive drug screen result due to possible interfering substances. A more specific alternate chemical method must be used in order to obtain a confirmed analytical result. Gas chromatography / mass spectrometry (GC/MS) is the preferred confirm                          atory method. Performed at [facility], [address], [place], [place] [address]     Blood Alcohol level:  Lab Results  Component Value Date   [facility] <15 01/12/2024   ETH 363 (HH) 12/10/2023    Metabolic Disorder Labs: Lab Results  Component Value Date   HGBA1C 4.6 (L) 01/12/2024   MPG 85.32 01/12/2024  No results found for: PROLACTIN Lab Results  Component Value Date   CHOL 235 (H) 01/12/2024   TRIG 151 (H) 01/12/2024   HDL >135 01/12/2024   CHOLHDL NOT CALCULATED 01/12/2024   VLDL 30 01/12/2024   LDLCALC NOT CALCULATED 01/12/2024    Therapeutic Lab Levels: No results found for: LITHIUM No results found for: VALPROATE No results found for: CBMZ  Physical Findings   CAGE-AID    Flowsheet Row ED to Hosp-Admission (Discharged) from 03/06/2023 in [facility]  CAGE-AID Score 0   Flowsheet Row ED from 01/14/2024 in [facility] ED from 01/12/2024 in [facility] ED from 12/10/2023 in [facility] Emergency Department at [facility]  C-SSRS RISK CATEGORY No Risk High Risk No Risk     Musculoskeletal  Strength & Muscle Tone: within normal limits Gait & Station: normal Patient leans: N/A  Psychiatric Specialty  Exam  Presentation  General Appearance:  Appropriate for Environment  Eye Contact: Good  Speech: Clear and Coherent  Speech Volume: Normal  Handedness: Right   Mood and Affect  Mood: Euthymic  Affect: Congruent   Thought Process  Thought Processes: Coherent; Goal Directed; Linear  Descriptions of Associations:Intact  Orientation:Full (Time, Place and Person)  Thought Content:WDL  Diagnosis of Schizophrenia or Schizoaffective disorder in past: No    Hallucinations: dnies  Ideas of Reference:None  Suicidal Thoughts:denies  Homicidal Thoughts:denies   Sensorium  Memory: Immediate Good; Recent Good; Remote Good  Judgment: Fair  Insight: Fair   Chartered certified accountant: Fair  Attention Span: Fair  Recall: [employer] of Knowledge: Fair  Language: Fair   Psychomotor Activity  Psychomotor Activity: No data recorded   Assets  Assets: Communication Skills; Desire for Improvement   Sleep  Sleep: No data recorded  Estimated Sleeping Duration (Last 24 Hours): 9.00 hours  No data recorded  Physical Exam   General: Well developed, well nourished, Caucasian female Pupils: Normal at 3mm Respiratory: Breathing is unlabored.  Cardiovascular: No edema.  Language: No anomia, no aphasia Muscle strength and tone-pt moving all extremities.  Gait not assessed as pt remained in bed.  Neuro: Facial muscles are symmetric. Pt without tremor, no evidence of hyperarousal.   Review of Systems  Constitutional: Negative.   HENT: Negative.    Eyes: Negative.   Respiratory: Negative.    Cardiovascular: Negative.   Gastrointestinal: Negative.   Genitourinary: Negative.   Musculoskeletal:  Positive for back pain and neck pain.  Skin: Negative.   Neurological: Negative.   Endo/Heme/Allergies: Negative.   Psychiatric/Behavioral: Negative.     Blood pressure 132/85, pulse 86, temperature 97.9 F (36.6 C), temperature source  Oral, resp. rate 16, SpO2 100%. There is no height or weight on file to calculate BMI.  Treatment Plan Summary: 58 year old female with psychiatric history of alcohol abuse, drug abuse, MDD, and GAD, who presented voluntarily as a walk-in to [facility] with complaints of worsening depression and seeking substance/alcohol abuse treatment.  She is also interested in long-term residential treatment.    7/31: patient is tolerating librium  taper and is future oriented on sobriety. Denies SI Or HI. Reports history of symptoms consistent with MDE however at this time with large alcohol induced component. She is requesting detox and inpatient rehab placement. Will consider starting naltrexone as patient completes librium  taper. She reports benefit from Lexapro  and Wellbutrin  and declines medication changes.  8/1: patient euthymic and future oriented. Reports chronic neuropathy  and anxiety and would like to complete gabapentin  trial. Denies SI, HI or AVH.  8/2:  atient euthymic and future oriented. Reports chronic neuropathy and anxiety and would like to increase dose to 200 mg TID. Denies side effects. Denies SI, HI or AVH.   #alcohol use disorder severe with withdrawal -continue Librium  taper + CIWA protocol -increase gabapentin  to 200 mg TID for anxiety, neuropathy and withdrawal,  Denies history of seizure or Dts however with significant withdrawal symptoms on admission   #MDD,severe recurrent without psychotic features -continue Lexapro  10 mg qdaily and Wellbutrin   XL 150 mg qdaily #hypothyrodism Cont levothyroxine   Dispo: inpatient rehab on Tuesday  Taelor Moncada, MD 01/16/2024 5:09 PM

## 2024-01-16 NOTE — ED Notes (Signed)
 Patient resting quietly in bed with eyes closed with unlabored breathing. Q 15 minute safety checks remain in place.  Pt remains safe on the unit at this time.

## 2024-01-16 NOTE — ED Notes (Signed)
 The patient is sitting in the dayroom, watching television, and socializing with other pts. No distress noted. Environment is secured. Plan of care ongoing, no further concerns as of present. Patient expresses no other needs at this time.

## 2024-01-16 NOTE — Group Note (Signed)
 Group Topic: Recovery Basics  Group Date: 01/16/2024 Start Time: 0800 End Time: 0900 Facilitators: Rolinda Monta ORN, NT  Department: [facility]  Number of Participants: 6  Group Focus: chemical dependency education, chemical dependency issues, co-dependency, and community group Treatment Modality:  Cognitive Behavioral Therapy Interventions utilized were patient education, story telling, and support Purpose: enhance coping skills, express feelings, and increase insight  Name: Jamie Johnson Date of Birth: 1965/08/01  MR: [id number]    Level of Participation: active Quality of Participation: attentive, cooperative, and engaged Interactions with others: gave feedback Mood/Affect: appropriate and tearful Triggers (if applicable): n/a Cognition: goal directed Progress: Gaining insight Response: n/a Plan: patient will be encouraged to participate in scheduled activities.  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   Recovering alcoholic in remission (HCC) 06/29/2023   Essential (primary) hypertension 06/29/2023   Hormone replacement therapy (postmenopausal) 06/29/2023   Rib fracture 03/06/2023   Chest pain 11/20/2022   Vitamin D insufficiency 10/24/2022   ACE-inhibitor cough 04/28/2022   Menopausal hot flushes 04/28/2022   Essential hypertension 12/10/2021   Postoperative pain 08/12/2019   Postmenopausal 01/24/2015   Allergic rhinitis 01/16/2015   Anxiety 01/16/2015   H/O alcohol abuse 01/16/2015   H/O drug abuse (HCC) 01/16/2015   Cannot sleep 01/16/2015   Thyroid   nodule 01/16/2015   Compulsive tobacco user syndrome 01/16/2015   Bradycardia, sinus 01/16/2015   Low back pain 01/16/2015   Hypothyroidism 12/01/2014

## 2024-01-16 NOTE — Group Note (Signed)
 Group Topic: Social Support  Group Date: 01/16/2024 Start Time: 1400 End Time: 1500 Facilitators: Nena Lesley PARAS, NT  Department: [facility]  Number of Participants: 6  Group Focus: check in, communication, and coping skills Treatment Modality:  Psychoeducation Interventions utilized were support Purpose: enhance coping skills and explore maladaptive thinking  Name: Jamie Johnson Date of Birth: Jan 21, 1966  MR: [id number]    Level of Participation: active Quality of Participation: engaged, initiates communication, and offered feedback Interactions with others: gave feedback Mood/Affect: appropriate Triggers (if applicable): N/A Cognition: coherent/clear Progress: Gaining insight Response: Pt discussed the current coping skills he uses including maladaptive coping skills. PT was redirected to focus on healthy coping skills and was receptive to trying novel coping skills. Plan: patient will be encouraged to continue coming to groups  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   Recovering alcoholic in remission (HCC) 06/29/2023   Essential (primary) hypertension 06/29/2023   Hormone replacement therapy (postmenopausal) 06/29/2023   Rib fracture 03/06/2023   Chest pain 11/20/2022   Vitamin D insufficiency 10/24/2022   ACE-inhibitor cough 04/28/2022   Menopausal hot flushes 04/28/2022   Essential hypertension 12/10/2021   Postoperative pain 08/12/2019   Postmenopausal 01/24/2015   Allergic rhinitis 01/16/2015   Anxiety 01/16/2015   H/O alcohol abuse 01/16/2015   H/O  drug abuse (HCC) 01/16/2015   Cannot sleep 01/16/2015   Thyroid  nodule 01/16/2015   Compulsive tobacco user syndrome 01/16/2015   Bradycardia, sinus 01/16/2015   Low back pain 01/16/2015   Hypothyroidism 12/01/2014

## 2024-01-16 NOTE — ED Notes (Signed)
 Pt observed in the dayroom and interacting appropriately on the milieu. She denies SI/HI/AVH. She c/o no BM for 2 days; PRN MOM given as per orders. She also c/o neck pain rated 7/10; PRN Flexeril  given as orders. He is seen eating and drinking fluids well. She is safe on the unit at this time with Q15 minute safety checks in place.

## 2024-01-16 NOTE — ED Notes (Signed)
 Patient A&Ox4. Denies intent to harm self/others when asked. Denies A/VH. Patient denies any physical complaints when asked. Pt states, last night I had the shakes. I guess that's normal but I'm ready to go to [facility]. Support and encouragement provided. Routine safety checks conducted according to facility protocol. Encouraged patient to notify staff if thoughts of harm toward self or others arise. Patient verbalize understanding and agreement. Will continue to monitor for safety.

## 2024-01-17 DIAGNOSIS — F10230 Alcohol dependence with withdrawal, uncomplicated: Secondary | ICD-10-CM | POA: Diagnosis not present

## 2024-01-17 DIAGNOSIS — F411 Generalized anxiety disorder: Secondary | ICD-10-CM | POA: Diagnosis not present

## 2024-01-17 DIAGNOSIS — F332 Major depressive disorder, recurrent severe without psychotic features: Secondary | ICD-10-CM | POA: Diagnosis not present

## 2024-01-17 DIAGNOSIS — G4700 Insomnia, unspecified: Secondary | ICD-10-CM | POA: Diagnosis not present

## 2024-01-17 MED ORDER — GABAPENTIN 100 MG PO CAPS: 200.0000 mg | ORAL_CAPSULE | Freq: Three times a day (TID) | ORAL | 0 refills | Status: AC

## 2024-01-17 MED ORDER — TRAZODONE HCL 50 MG PO TABS
50.0000 mg | ORAL_TABLET | Freq: Every evening | ORAL | Status: DC | PRN
Start: 2024-01-17 — End: 2024-01-18
  Administered 2024-01-17 (×2): 50 mg via ORAL
  Filled 2024-01-17 (×2): qty 1

## 2024-01-17 MED ORDER — ENSURE PLUS HIGH PROTEIN PO LIQD
237.0000 mL | Freq: Two times a day (BID) | ORAL | Status: DC
  Administered 2024-01-17: 237 mL via ORAL
  Filled 2024-01-17: qty 237

## 2024-01-17 MED ORDER — CLONIDINE HCL 0.1 MG PO TABS
0.1000 mg | ORAL_TABLET | Freq: Once | ORAL | Status: AC
Start: 2024-01-17 — End: 2024-01-17
  Administered 2024-01-17: 0.1 mg via ORAL
  Filled 2024-01-17: qty 1

## 2024-01-17 NOTE — Group Note (Signed)
 Group Topic: Healthy Self Image and Positive Change  Group Date: 01/17/2024 Start Time: 2000 End Time: 2030 Facilitators: Verdon Jacqualyn BRAVO, NT  Department: [facility]  Number of Participants: 10  Group Focus: self-awareness Treatment Modality:  Individual Therapy Interventions utilized were group exercise Purpose: reinforce self-care  Name: Jamie Johnson Date of Birth: Oct 23, 1965  MR: [id number]    Level of Participation: active Quality of Participation: cooperative Interactions with others: gave feedback Mood/Affect: appropriate Triggers (if applicable): n/a Cognition: coherent/clear Progress: Moderate Response:  self care- to relieve pain in my neck and back and have no cramps for alcohol or  cigarettes, I feel depleted that even with medications my pain is not improving, however cramps have reduced. Want to keep moving forward with my recovery of my addiction and free of smoking Plan: follow-up needed  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   Recovering alcoholic in remission (HCC) 06/29/2023   Essential (primary) hypertension 06/29/2023   Hormone replacement therapy (postmenopausal) 06/29/2023   Rib fracture 03/06/2023   Chest pain 11/20/2022   Vitamin D insufficiency 10/24/2022   ACE-inhibitor cough 04/28/2022   Menopausal hot flushes 04/28/2022   Essential hypertension 12/10/2021   Postoperative pain 08/12/2019   Postmenopausal 01/24/2015   Allergic rhinitis 01/16/2015   Anxiety 01/16/2015   H/O alcohol abuse 01/16/2015   H/O drug abuse (HCC)  01/16/2015   Cannot sleep 01/16/2015   Thyroid  nodule 01/16/2015   Compulsive tobacco user syndrome 01/16/2015   Bradycardia, sinus 01/16/2015   Low back pain 01/16/2015   Hypothyroidism 12/01/2014

## 2024-01-17 NOTE — ED Notes (Signed)
 Pt has been med focused this afternoon.   Cooperative.  Denied current SI plan and intent.  CIWA score at 1200 was 0 Q 15 minute observation for safety continue

## 2024-01-17 NOTE — ED Notes (Signed)
 Pt is observed watching television in the dayroom and attended AA group. Pt denies SI/HI/AVH. Pt c/o neck and back pain rated 6/10. PRN Flexeril   given as per orders. She also c/o anxiety. PRN Atarax  given as per orders. Pt is safe on the unit at this time with Q 15 min safety checks in place.

## 2024-01-17 NOTE — Group Note (Signed)
 Group Topic: Social Support  Group Date: 01/17/2024 Start Time: 0830 End Time: 0910 Facilitators: Celinda Suzen NOVAK, NT  Department: [facility]  Number of Participants: 7  Group Focus: goals/reality orientation Treatment Modality:  Psychoeducation Interventions utilized were support Purpose: increase insight and regain self-worth  Name: Jamie Johnson Date of Birth: 09/07/1965  MR: [id number]    Level of Participation: active Quality of Participation: attentive, cooperative, and engaged Interactions with others: gave feedback Mood/Affect: appropriate Triggers (if applicable): n/a Cognition: coherent/clear Progress: Gaining insight Response: PT spoke about how she binge drinks and wants to stop/is tired. PT stated she wants to be supportive of other Pts. Plan: patient will be encouraged to attend group  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   Recovering alcoholic in remission (HCC) 06/29/2023   Essential (primary) hypertension 06/29/2023   Hormone replacement therapy (postmenopausal) 06/29/2023   Rib fracture 03/06/2023   Chest pain 11/20/2022   Vitamin D insufficiency 10/24/2022   ACE-inhibitor cough 04/28/2022   Menopausal hot flushes 04/28/2022   Essential hypertension 12/10/2021   Postoperative pain 08/12/2019   Postmenopausal 01/24/2015   Allergic rhinitis 01/16/2015   Anxiety 01/16/2015   H/O alcohol abuse 01/16/2015   H/O drug abuse (HCC) 01/16/2015   Cannot sleep 01/16/2015   Thyroid  nodule 01/16/2015   Compulsive tobacco user syndrome 01/16/2015    Bradycardia, sinus 01/16/2015   Low back pain 01/16/2015   Hypothyroidism 12/01/2014

## 2024-01-17 NOTE — ED Notes (Addendum)
 Pt is observed watching television in the dayroom eating snack. Pt denies SI/HI/AVH. Pt c/o back ache rated 8/10. PRN Flexeril  given as per orders. BP was 144/91 manual around 2030. NP notified and Clonidine  0.1 mg ordered and administered. Will recheck BP an hour after administration. Pt is safe on the unit at this time with Q 15 min safety checks in place.

## 2024-01-17 NOTE — ED Notes (Signed)
 Patient resting quietly in bed with eyes closed with unlabored breathing. Q 15 minute safety checks remain in place.  Pt remains safe on the unit at this time.

## 2024-01-17 NOTE — ED Notes (Signed)
 Pt libirum taper is complete.    No observations or reports of acute withdrawals.  CIWA score 0 Q 15 minute observation for safety continue

## 2024-01-17 NOTE — ED Notes (Signed)
 Pt observed in dayroom interacting with peers appropriately.    Q 15 minute observations for safety continue

## 2024-01-17 NOTE — ED Provider Notes (Signed)
 Behavioral Health Progress Note  Date and Time: 01/17/2024 1:41 PM Name: Jamie Johnson MRN:  [id number] ID: 58 year old female with psychiatric history of alcohol abuse, drug abuse, MDD, and GAD, who presented voluntarily as a walk-in to [facility] with complaints of worsening depression and seeking substance/alcohol abuse treatment.   Subjective:  Patient reports improving mood, sleep, and appetite. She states she feels good. Reports she did wake up at 1:30 AM last night but fell asleep after trazodone  dose. Reports continued neck and back pain but improving neuropathy and hands and feet with gabapentin  200 mg TID. Denies side effects. Denies dizziness, ataxia or leg swelling. Denies wanting to be dead. Denies SI, HI or AVH.  Patient is attending groups and journaling. Denies alcohol or nicotine  cravings and declines starting naltrexone or acamprosate. Patient denies feeling hopeless and is future oriented on inpatient rehab placement. Denies nightmares or recurrent memories. She is not responding to internal stimuli.   Diagnosis:  Final diagnoses:  Alcohol dependence with uncomplicated withdrawal (HCC)  Severe episode of recurrent major depressive disorder, without psychotic features (HCC)  GAD (generalized anxiety disorder)    Total Time spent with patient: 30 minutes  Past Psychiatric History:  Major depressive disorder versus substance-induced mood disorder, alcohol use disorder, severe, recurrent, in current episode  Suicidal gesture with butter knife 3 months ago but never actually cut herself or went to the hospital, occurred after boyfriend kicked her out  Past Medical History: Patient has degenerative disk disease in upper C-spine, which she is in the process of receiving disability for.  Hypothyroidism (patient has an elevated TSH).  Currently going through menopause, prescribed estrogen. Family Psychiatric  History: Alcoholism in father Social History: Drinks 0.5 gallons of liquor  every other day.    Additional Social History:                         Sleep: Fair  Appetite:  Fair  Current Medications:  Current Facility-Administered Medications  Medication Dose Route Frequency Provider Last Rate Last Admin   acetaminophen  (TYLENOL ) tablet 650 mg  650 mg Oral Q6H PRN Rollene Katz, MD   650 mg at 01/16/24 1332   alum & mag hydroxide-simeth (MAALOX/MYLANTA) 200-200-20 MG/5ML suspension 30 mL  30 mL Oral Q4H PRN Rollene Katz, MD       buPROPion  (WELLBUTRIN  XL) 24 hr tablet 150 mg  150 mg Oral q morning Rollene Katz, MD   150 mg at 01/17/24 9055   cyclobenzaprine  (FLEXERIL ) tablet 5 mg  5 mg Oral TID PRN Rollene Katz, MD   5 mg at 01/17/24 9240   haloperidol  (HALDOL ) tablet 5 mg  5 mg Oral TID PRN Rollene Katz, MD       And   diphenhydrAMINE  (BENADRYL ) capsule 50 mg  50 mg Oral TID PRN Rollene Katz, MD       haloperidol  lactate (HALDOL ) injection 5 mg  5 mg Intramuscular TID PRN Rollene Katz, MD       And   diphenhydrAMINE  (BENADRYL ) injection 50 mg  50 mg Intramuscular TID PRN Rollene Katz, MD       And   LORazepam  (ATIVAN ) injection 2 mg  2 mg Intramuscular TID PRN Rollene Katz, MD       haloperidol  lactate (HALDOL ) injection 10 mg  10 mg Intramuscular TID PRN Rollene Katz, MD       And   diphenhydrAMINE  (BENADRYL ) injection 50 mg  50 mg Intramuscular TID PRN Rollene Katz,  MD       And   LORazepam  (ATIVAN ) injection 2 mg  2 mg Intramuscular TID PRN Rollene Katz, MD       escitalopram  (LEXAPRO ) tablet 10 mg  10 mg Oral Daily Jenetta Wease, MD   10 mg at 01/17/24 0944   estradiol  (ESTRACE ) tablet 1 mg  1 mg Oral Daily Rollene Katz, MD   1 mg at 01/17/24 0945   feeding supplement (ENSURE PLUS HIGH PROTEIN) liquid 237 mL  237 mL Oral BID BM Lujean Ebright, MD       gabapentin  (NEURONTIN ) capsule 200 mg  200 mg Oral TID Edwardine Deschepper, MD   200 mg at 01/17/24 0945   hydrOXYzine   (ATARAX ) tablet 25 mg  25 mg Oral TID PRN Rollene Katz, MD   25 mg at 01/16/24 2113   levothyroxine  (SYNTHROID ) tablet 150 mcg  150 mcg Oral QHS Crawford, Benjamin, MD   150 mcg at 01/16/24 2113   magnesium  hydroxide (MILK OF MAGNESIA) suspension 30 mL  30 mL Oral Daily PRN Rollene Katz, MD   30 mL at 01/15/24 1954   multivitamin with minerals tablet 1 tablet  1 tablet Oral Daily Rollene Katz, MD   1 tablet at 01/17/24 0945   nicotine  (NICODERM CQ  - dosed in mg/24 hours) patch 14 mg  14 mg Transdermal Q0600 Rollene Katz, MD   14 mg at 01/16/24 1016   thiamine  (VITAMIN B1) tablet 100 mg  100 mg Oral Daily Rollene Katz, MD   100 mg at 01/17/24 0945   traZODone  (DESYREL ) tablet 50 mg  50 mg Oral QHS PRN Bobbitt, Shalon E, NP   50 mg at 01/17/24 0202   Current Outpatient Medications  Medication Sig Dispense Refill   buPROPion  (WELLBUTRIN  XL) 150 MG 24 hr tablet Take 1 tablet (150 mg total) by mouth every morning. 30 tablet 0   Cholecalciferol (VITAMIN D3) 25 MCG (1000 UT) CAPS Take 1,000 Units by mouth daily.     cyclobenzaprine  (FLEXERIL ) 5 MG tablet Take 1 tablet (5 mg total) by mouth 3 (three) times daily as needed (neck pain). 90 tablet 0   escitalopram  (LEXAPRO ) 10 MG tablet Take 1 tablet (10 mg total) by mouth daily. 30 tablet 0   estradiol  (ESTRACE ) 1 MG tablet Take 1 tablet (1 mg total) by mouth daily. 30 tablet 0   gabapentin  (NEURONTIN ) 100 MG capsule Take 2 capsules (200 mg total) by mouth 3 (three) times daily. 180 capsule 0   hydrOXYzine  (ATARAX ) 25 MG tablet Take 1 tablet (25 mg total) by mouth 3 (three) times daily as needed for anxiety. 60 tablet 0   levothyroxine  (SYNTHROID ) 150 MCG tablet Take 1 tablet (150 mcg total) by mouth at bedtime. 30 tablet 0   nicotine  (NICODERM CQ  - DOSED IN MG/24 HOURS) 14 mg/24hr patch Place 1 patch (14 mg total) onto the skin daily at 6 (six) AM. 30 patch 0   ondansetron  (ZOFRAN -ODT) 4 MG disintegrating tablet Take 1 tablet  (4 mg total) by mouth every 8 (eight) hours as needed for nausea or vomiting. 20 tablet 0   potassium chloride  (KLOR-CON  M) 10 MEQ tablet Take 10 mEq by mouth daily.      Labs  Lab Results:  Admission on 01/12/2024, Discharged on 01/14/2024  Component Date Value Ref Range Status   WBC 01/12/2024 10.4  4.0 - 10.5 K/uL Final   RBC 01/12/2024 4.32  3.87 - 5.11 MIL/uL Final   Hemoglobin 01/12/2024 14.6  12.0 - 15.0  g/dL Final   HCT 92/70/7974 42.4  36.0 - 46.0 % Final   MCV 01/12/2024 98.1  80.0 - 100.0 fL Final   MCH 01/12/2024 33.8  26.0 - 34.0 pg Final   MCHC 01/12/2024 34.4  30.0 - 36.0 g/dL Final   RDW 92/70/7974 14.2  11.5 - 15.5 % Final   Platelets 01/12/2024 259  150 - 400 K/uL Final   nRBC 01/12/2024 0.0  0.0 - 0.2 % Final   Neutrophils Relative % 01/12/2024 72  % Final   Neutro Abs 01/12/2024 7.5  1.7 - 7.7 K/uL Final   Lymphocytes Relative 01/12/2024 20  % Final   Lymphs Abs 01/12/2024 2.0  0.7 - 4.0 K/uL Final   Monocytes Relative 01/12/2024 7  % Final   Monocytes Absolute 01/12/2024 0.7  0.1 - 1.0 K/uL Final   Eosinophils Relative 01/12/2024 0  % Final   Eosinophils Absolute 01/12/2024 0.0  0.0 - 0.5 K/uL Final   Basophils Relative 01/12/2024 1  % Final   Basophils Absolute 01/12/2024 0.1  0.0 - 0.1 K/uL Final   Immature Granulocytes 01/12/2024 0  % Final   Abs Immature Granulocytes 01/12/2024 0.03  0.00 - 0.07 K/uL Final   Performed at [facility], [address] [place], [place] [address]   Sodium 01/12/2024 136  135 - 145 mmol/L Final   Potassium 01/12/2024 3.5  3.5 - 5.1 mmol/L Final   Chloride 01/12/2024 98  98 - 111 mmol/L Final   CO2 01/12/2024 26  22 - 32 mmol/L Final   Glucose, Bld 01/12/2024 96  70 - 99 mg/dL Final   Glucose reference range applies only to samples taken after fasting for at least 8 hours.   BUN 01/12/2024 9  6 - 20 mg/dL Final   Creatinine, Ser 01/12/2024 0.99  0.44 - 1.00 mg/dL Final   Calcium 92/70/7974 9.7  8.9 - 10.3 mg/dL Final    Total Protein 01/12/2024 6.7  6.5 - 8.1 g/dL Final   Albumin 92/70/7974 4.2  3.5 - 5.0 g/dL Final   AST 92/70/7974 43 (H)  15 - 41 U/L Final   ALT 01/12/2024 25  0 - 44 U/L Final   Alkaline Phosphatase 01/12/2024 69  38 - 126 U/L Final   Total Bilirubin 01/12/2024 1.2  0.0 - 1.2 mg/dL Final   GFR, Estimated 01/12/2024 >60  >60 mL/min Final   Comment: (NOTE) Calculated using the CKD-EPI Creatinine Equation (2021)    Anion gap 01/12/2024 12  5 - 15 Final   Performed at [facility], [address], [place], [place] [address]   Hgb A1c MFr Bld 01/12/2024 4.6 (L)  4.8 - 5.6 % Final   Comment: (NOTE) Diagnosis of Diabetes The following HbA1c ranges recommended by the American Diabetes Association (ADA) may be used as an aid in the diagnosis of diabetes mellitus.  Hemoglobin             Suggested A1C NGSP%              Diagnosis  <5.7                   Non Diabetic  5.7-6.4                Pre-Diabetic  >6.4                   Diabetic  <7.0  Glycemic control for                       adults with diabetes.     Mean Plasma Glucose 01/12/2024 85.32  mg/dL Final   Performed at [facility], [address], [place], [place] [address]   Alcohol, Ethyl (B) 01/12/2024 <15  <15 mg/dL Final   Comment: (NOTE) For medical purposes only. Performed at [facility], [address], [place], [place] [address]    Cholesterol 01/12/2024 235 (H)  0 - 200 mg/dL Final   Triglycerides 92/70/7974 151 (H)  <150 mg/dL Final   HDL 92/70/7974 >135  >40 mg/dL Final   Total CHOL/HDL Ratio 01/12/2024 NOT CALCULATED  RATIO Final   VLDL 01/12/2024 30  0 - 40 mg/dL Final   LDL Cholesterol 01/12/2024 NOT CALCULATED  0 - 99 mg/dL Final   Performed at [facility], [address], [place], [place] [address]   TSH 01/12/2024 43.386 (H)  0.350 - 4.500 uIU/mL Final   Comment: Performed by a 3rd Generation assay with a functional sensitivity of <=0.01 uIU/mL. Performed  at [facility], [address], [place], [place] [address]    Preg Test, Ur 01/12/2024 Negative  Negative Final   POC Amphetamine UR 01/12/2024 None Detected  NONE DETECTED (Cut Off Level 1000 ng/mL) Final   POC Secobarbital (BAR) 01/12/2024 None Detected  NONE DETECTED (Cut Off Level 300 ng/mL) Final   POC Buprenorphine (BUP) 01/12/2024 None Detected  NONE DETECTED (Cut Off Level 10 ng/mL) Final   POC Oxazepam (BZO) 01/12/2024 None Detected  NONE DETECTED (Cut Off Level 300 ng/mL) Final   POC Cocaine UR 01/12/2024 None Detected  NONE DETECTED (Cut Off Level 300 ng/mL) Final   POC Methamphetamine UR 01/12/2024 None Detected  NONE DETECTED (Cut Off Level 1000 ng/mL) Final   POC Morphine  01/12/2024 None Detected  NONE DETECTED (Cut Off Level 300 ng/mL) Final   POC Methadone UR 01/12/2024 None Detected  NONE DETECTED (Cut Off Level 300 ng/mL) Final   POC Oxycodone  UR 01/12/2024 None Detected  NONE DETECTED (Cut Off Level 100 ng/mL) Final   POC Marijuana UR 01/12/2024 None Detected  NONE DETECTED (Cut Off Level 50 ng/mL) Final  Admission on 12/10/2023, Discharged on 12/10/2023  Component Date Value Ref Range Status   Sodium 12/10/2023 147 (H)  135 - 145 mmol/L Final   Potassium 12/10/2023 4.1  3.5 - 5.1 mmol/L Final   Chloride 12/10/2023 109  98 - 111 mmol/L Final   CO2 12/10/2023 24  22 - 32 mmol/L Final   Glucose, Bld 12/10/2023 83  70 - 99 mg/dL Final   Glucose reference range applies only to samples taken after fasting for at least 8 hours.   BUN 12/10/2023 14  6 - 20 mg/dL Final   Creatinine, Ser 12/10/2023 0.87  0.44 - 1.00 mg/dL Final   Calcium 93/73/7974 8.9  8.9 - 10.3 mg/dL Final   Total Protein 93/73/7974 7.2  6.5 - 8.1 g/dL Final   Albumin 93/73/7974 4.3  3.5 - 5.0 g/dL Final   AST 93/73/7974 84 (H)  15 - 41 U/L Final   ALT 12/10/2023 42  0 - 44 U/L Final   Alkaline Phosphatase 12/10/2023 60  38 - 126 U/L Final   Total Bilirubin 12/10/2023 0.8  0.0 - 1.2 mg/dL Final    GFR, Estimated 12/10/2023 >60  >60 mL/min Final   Comment: (NOTE) Calculated using  the CKD-EPI Creatinine Equation (2021)    Anion gap 12/10/2023 14  5 - 15 Final   Performed at [facility], [address] [place], [place] [address]   Alcohol, Ethyl (B) 12/10/2023 363 (HH)  <15 mg/dL Final   Comment: CRITICAL RESULT CALLED TO, READ BACK BY AND VERIFIED WITH MAGGIE BARBER @1810  12/10/23 MJU (NOTE) For medical purposes only. Performed at [facility], [address] [place], [place] [address]    WBC 12/10/2023 7.5  4.0 - 10.5 K/uL Final   RBC 12/10/2023 4.01  3.87 - 5.11 MIL/uL Final   Hemoglobin 12/10/2023 13.4  12.0 - 15.0 g/dL Final   HCT 93/73/7974 39.7  36.0 - 46.0 % Final   MCV 12/10/2023 99.0  80.0 - 100.0 fL Final   MCH 12/10/2023 33.4  26.0 - 34.0 pg Final   MCHC 12/10/2023 33.8  30.0 - 36.0 g/dL Final   RDW 93/73/7974 14.0  11.5 - 15.5 % Final   Platelets 12/10/2023 244  150 - 400 K/uL Final   nRBC 12/10/2023 0.0  0.0 - 0.2 % Final   Performed at [facility], [address] [place], [place] [address]   Tricyclic, Ur Screen 12/10/2023 NONE DETECTED  NONE DETECTED Final   Amphetamines, Ur Screen 12/10/2023 NONE DETECTED  NONE DETECTED Final   MDMA (Ecstasy)Ur Screen 12/10/2023 NONE DETECTED  NONE DETECTED Final   Cocaine Metabolite,Ur [place] 12/10/2023 NONE DETECTED  NONE DETECTED Final   Opiate, Ur Screen 12/10/2023 NONE DETECTED  NONE DETECTED Final   Phencyclidine (PCP) Ur S 12/10/2023 NONE DETECTED  NONE DETECTED Final   Cannabinoid 50 Ng, Ur [place] 12/10/2023 NONE DETECTED  NONE DETECTED Final   Barbiturates, Ur Screen 12/10/2023 NONE DETECTED  NONE DETECTED Final   Benzodiazepine, Ur Scrn 12/10/2023 NONE DETECTED  NONE DETECTED Final   Methadone Scn, Ur 12/10/2023 NONE DETECTED  NONE DETECTED Final   Comment: (NOTE) Tricyclics + metabolites, urine    Cutoff 1000 ng/mL Amphetamines + metabolites, urine  Cutoff 1000 ng/mL MDMA (Ecstasy), urine               Cutoff 500 ng/mL Cocaine Metabolite, urine          Cutoff 300 ng/mL Opiate + metabolites, urine        Cutoff 300 ng/mL Phencyclidine (PCP), urine         Cutoff 25 ng/mL Cannabinoid, urine                 Cutoff 50 ng/mL Barbiturates + metabolites, urine  Cutoff 200 ng/mL Benzodiazepine, urine              Cutoff 200 ng/mL Methadone, urine                   Cutoff 300 ng/mL  The urine drug screen provides only a preliminary, unconfirmed analytical test result and should not be used for non-medical purposes. Clinical consideration and professional judgment should be applied to any positive drug screen result due to possible interfering substances. A more specific alternate chemical method must be used in order to obtain a confirmed analytical result. Gas chromatography / mass spectrometry (GC/MS) is the preferred confirm                          atory method. Performed at [facility], [address], [place], [place] [address]     Blood Alcohol level:  Lab Results  Component Value  Date   ETH <15 01/12/2024   ETH 363 (HH) 12/10/2023    Metabolic Disorder Labs: Lab Results  Component Value Date   HGBA1C 4.6 (L) 01/12/2024   MPG 85.32 01/12/2024   No results found for: PROLACTIN Lab Results  Component Value Date   CHOL 235 (H) 01/12/2024   TRIG 151 (H) 01/12/2024   HDL >135 01/12/2024   CHOLHDL NOT CALCULATED 01/12/2024   VLDL 30 01/12/2024   LDLCALC NOT CALCULATED 01/12/2024    Therapeutic Lab Levels: No results found for: LITHIUM No results found for: VALPROATE No results found for: CBMZ  Physical Findings   CAGE-AID    Flowsheet Row ED to Hosp-Admission (Discharged) from 03/06/2023 in [facility]  CAGE-AID Score 0   Flowsheet Row ED from 01/14/2024 in [facility] ED from 01/12/2024 in [facility] ED from 12/10/2023 in [facility]  Emergency Department at [facility]  C-SSRS RISK CATEGORY No Risk High Risk No Risk     Musculoskeletal  Strength & Muscle Tone: within normal limits Gait & Station: normal Patient leans: N/A  Psychiatric Specialty Exam  Presentation  General Appearance:  Appropriate for Environment  Eye Contact: Good  Speech: Clear and Coherent  Speech Volume: Normal  Handedness: Right   Mood and Affect  Mood: Euthymic  Affect: Congruent   Thought Process  Thought Processes: Coherent; Goal Directed; Linear  Descriptions of Associations:Intact  Orientation:Full (Time, Place and Person)  Thought Content:WDL  Diagnosis of Schizophrenia or Schizoaffective disorder in past: No    Hallucinations: dnies  Ideas of Reference:None  Suicidal Thoughts:denies  Homicidal Thoughts:denies   Sensorium  Memory: Immediate Good; Recent Good; Remote Good  Judgment: Fair  Insight: Fair   Chartered certified accountant: Fair  Attention Span: Fair  Recall: [employer] of Knowledge: Fair  Language: Fair   Psychomotor Activity  Psychomotor Activity: No data recorded   Assets  Assets: Communication Skills; Desire for Improvement   Sleep  Sleep: No data recorded  Estimated Sleeping Duration (Last 24 Hours): 7.75-8.00 hours  No data recorded  Physical Exam   General: Well developed, well nourished, Caucasian female Pupils: Normal at 3mm Respiratory: Breathing is unlabored.  Cardiovascular: No edema.  Language: No anomia, no aphasia Muscle strength and tone-pt moving all extremities.  Gait not assessed as pt remained in bed.  Neuro: Facial muscles are symmetric. Pt without tremor, no evidence of hyperarousal.   Review of Systems  Constitutional: Negative.   HENT: Negative.    Eyes: Negative.   Respiratory: Negative.    Cardiovascular: Negative.   Gastrointestinal: Negative.   Genitourinary: Negative.   Musculoskeletal:  Positive for  back pain and neck pain.  Skin: Negative.   Neurological: Negative.   Endo/Heme/Allergies: Negative.   Psychiatric/Behavioral: Negative.     Blood pressure 138/85, pulse 68, temperature 97.8 F (36.6 C), temperature source Oral, resp. rate 16, SpO2 98%. There is no height or weight on file to calculate BMI.  Treatment Plan Summary: 58 year old female with psychiatric history of alcohol abuse, drug abuse, MDD, and GAD, who presented voluntarily as a walk-in to [facility] with complaints of worsening depression and seeking substance/alcohol abuse treatment.  She is also interested in long-term residential treatment.    7/31: patient is tolerating librium  taper and is future oriented on sobriety. Denies SI Or HI. Reports history of symptoms consistent with MDE however at this time with large alcohol induced component. She  is requesting detox and inpatient rehab placement. Will consider starting naltrexone as patient completes librium  taper. She reports benefit from Lexapro  and Wellbutrin  and declines medication changes.  8/1: patient euthymic and future oriented. Reports chronic neuropathy and anxiety and would like to complete gabapentin  trial. Denies SI, HI or AVH.  8/2:  patient euthymic and future oriented. Reports chronic neuropathy and anxiety and would like to increase dose to 200 mg TID. Denies side effects. Denies SI, HI or AVH.  8/3: Denies alcohol or nicotine  cravings and declines starting naltrexone or acamprosate. patient euthymic and future oriented.  And declines starting naltrexone or acamprosate stating she is not having any cravings. Denies SI, HI or AVH.   #alcohol use disorder severe with withdrawal -finish Librium  taper + CIWA protocol -cont gabapentin  to 200 mg TID for anxiety, neuropathy and withdrawal,  Denies history of seizure or Dts however with significant withdrawal symptoms on admission   #MDD,severe recurrent without psychotic features -continue Lexapro  10 mg  qdaily and cont Wellbutrin   XL 150 mg qdaily #hypothyrodism Cont levothyroxine   Dispo: inpatient rehab on Tuesday  Doctor Sheahan, MD 01/17/2024 1:41 PM

## 2024-01-18 DIAGNOSIS — F411 Generalized anxiety disorder: Secondary | ICD-10-CM | POA: Diagnosis not present

## 2024-01-18 DIAGNOSIS — G4700 Insomnia, unspecified: Secondary | ICD-10-CM | POA: Diagnosis not present

## 2024-01-18 DIAGNOSIS — F10230 Alcohol dependence with withdrawal, uncomplicated: Secondary | ICD-10-CM | POA: Diagnosis not present

## 2024-01-18 DIAGNOSIS — F332 Major depressive disorder, recurrent severe without psychotic features: Secondary | ICD-10-CM | POA: Diagnosis not present

## 2024-01-18 NOTE — ED Notes (Signed)
 Stable. A&O x 4.  Discharging to [facility] via [employer]   14 day supply of medications and 30 day paper scripts sent with patient   Denies current SI plan and Intent.  Denies HI and A/V hallucinations.   All belongings and valuables sent with patient in taxi.

## 2024-01-18 NOTE — ED Notes (Signed)
 Patient resting quietly in bed with eyes closed and unlabored breathing. In no acute distress. Q 15 minute safety checks remain in place.

## 2024-01-18 NOTE — ED Notes (Signed)
 Patient resting quietly in bed with eyes closed with unlabored breathing. Q 15 minute safety checks remain in place.

## 2024-01-25 DIAGNOSIS — F1020 Alcohol dependence, uncomplicated: Secondary | ICD-10-CM | POA: Diagnosis not present

## 2024-02-01 ENCOUNTER — Encounter: Payer: Self-pay | Admitting: Physician Assistant

## 2024-02-01 ENCOUNTER — Ambulatory Visit: Admitting: Physician Assistant

## 2024-02-01 VITALS — BP 136/76 | HR 77 | Ht 67.0 in | Wt 153.0 lb

## 2024-02-01 DIAGNOSIS — I10 Essential (primary) hypertension: Secondary | ICD-10-CM | POA: Diagnosis not present

## 2024-02-01 DIAGNOSIS — F1011 Alcohol abuse, in remission: Secondary | ICD-10-CM

## 2024-02-01 DIAGNOSIS — E890 Postprocedural hypothyroidism: Secondary | ICD-10-CM | POA: Diagnosis not present

## 2024-02-01 DIAGNOSIS — R600 Localized edema: Secondary | ICD-10-CM | POA: Diagnosis not present

## 2024-02-01 DIAGNOSIS — E785 Hyperlipidemia, unspecified: Secondary | ICD-10-CM | POA: Diagnosis not present

## 2024-02-01 DIAGNOSIS — E559 Vitamin D deficiency, unspecified: Secondary | ICD-10-CM

## 2024-02-01 DIAGNOSIS — M62838 Other muscle spasm: Secondary | ICD-10-CM

## 2024-02-01 MED ORDER — AMLODIPINE BESYLATE 5 MG PO TABS: 5.0000 mg | ORAL_TABLET | Freq: Every day | ORAL | 1 refills | Status: DC

## 2024-02-01 NOTE — Patient Instructions (Signed)
 VISIT SUMMARY:  Today, we discussed your thyroid  medication, blood pressure management, muscle cramps, and other health concerns. We reviewed your recent thyroid  levels, blood pressure readings, and symptoms such as foot cramps and swelling in your hands and legs. We also talked about your water intake and recent abstinence from alcohol.  YOUR PLAN:  -PRIMARY HYPERTENSION WITH PERIPHERAL EDEMA: Primary hypertension means high blood pressure, and peripheral edema refers to swelling in your hands and legs. We will restart you on a low dose of amlodipine  to help manage your blood pressure. Please monitor your blood pressure daily and write down the readings. We will review these readings at your next visit.  -POST-HEMITHYROIDECTOMY HYPOTHYROIDISM: Post-hemithyroidectomy hypothyroidism means low thyroid  function after having part of your thyroid  removed. Your thyroid  levels were high during your recent hospitalization. We will order thyroid  function tests to check your current levels and adjust your medication dosage if needed.  -MUSCLE CRAMPS OF FEET: Muscle cramps in your feet could be due to dehydration, an imbalance of electrolytes, or reduced activity. We will check your potassium and other electrolyte levels. Try doing stretching exercises for your feet and consider adding a small amount of salt to your water if you are drinking large amounts.  -VITAMIN D  DEFICIENCY (SUSPECTED): Vitamin D  deficiency means you might not have enough vitamin D , which can contribute to muscle cramps. We will order a vitamin D  level test to check this.  -HYPERLIPIDEMIA: Hyperlipidemia means high cholesterol levels. We will schedule a fasting lipid panel at your next visit to get accurate cholesterol readings.

## 2024-02-01 NOTE — Progress Notes (Unsigned)
 New Patient Office Visit  Subjective    Patient ID: Jamie Johnson, female    DOB: [birth year]  Age: 58 y.o. MRN: [id number]  CC:  Chief Complaint  Patient presents with   Edema    Patient has been experiencing swelling in hands and legs. She has a hx of Hypertension, but has been off medication since July.    Medication Refill   Discussed the use of AI scribe software for clinical note transcription with the patient, who gave verbal consent to proceed.  History of Present Illness   Jamie Johnson is a 58 year old female who presents for follow-up on thyroid  medication and blood pressure management.  Her thyroid  level was significantly elevated at 43 during a hospitalization on July 29th. She is currently taking 150 mg of thyroid  medication, which was lowered prior to the hospitalization. She had a partial thyroidectomy in 2015.  States that she has been consistent with her thyroid  medication  She experiences foot cramps a couple of times a week, initially attributing them to dehydration from alcohol detoxification. She has been abstinent from alcohol for 22 days. She consumes 10 to 16 six-ounce cups of water daily due to excessive thirst and questions if her water intake might be excessive.  She has hypertension and was previously on amlodipine , though she does not recall the dosage. Recent blood pressure measurements have been 136/76, 147/86, and 152/72. She experiences swelling in her hands and legs, which is worse in the mornings.  States this has been ongoing since she has not been on the amlodipine  which was at the beginning of July.  She is currently being treated for substance abuse at [facility] recovery center, is going into the long-term program       Outpatient Encounter Medications as of 02/01/2024  Medication Sig   amLODipine  (NORVASC ) 5 MG tablet Take 1 tablet (5 mg total) by mouth daily.   buPROPion  (WELLBUTRIN  XL) 150 MG 24 hr tablet Take 1 tablet (150 mg total) by mouth  every morning.   Cholecalciferol (VITAMIN D3) 25 MCG (1000 UT) CAPS Take 1,000 Units by mouth daily.   cyclobenzaprine  (FLEXERIL ) 5 MG tablet Take 1 tablet (5 mg total) by mouth 3 (three) times daily as needed (neck pain).   escitalopram  (LEXAPRO ) 10 MG tablet Take 1 tablet (10 mg total) by mouth daily.   estradiol  (ESTRACE ) 1 MG tablet Take 1 tablet (1 mg total) by mouth daily.   gabapentin  (NEURONTIN ) 100 MG capsule Take 2 capsules (200 mg total) by mouth 3 (three) times daily.   hydrOXYzine  (ATARAX ) 25 MG tablet Take 1 tablet (25 mg total) by mouth 3 (three) times daily as needed for anxiety.   levothyroxine  (SYNTHROID ) 150 MCG tablet Take 1 tablet (150 mcg total) by mouth at bedtime.   nicotine  (NICODERM CQ  - DOSED IN MG/24 HOURS) 14 mg/24hr patch Place 1 patch (14 mg total) onto the skin daily at 6 (six) AM.   ondansetron  (ZOFRAN -ODT) 4 MG disintegrating tablet Take 1 tablet (4 mg total) by mouth every 8 (eight) hours as needed for nausea or vomiting.   potassium chloride  (KLOR-CON  M) 10 MEQ tablet Take 10 mEq by mouth daily.   No facility-administered encounter medications on file as of 02/01/2024.    Past Medical History:  Diagnosis Date   Arthritis    Complication of anesthesia    has very slow heartrate-drops with any anesthesia to 28   DJD (degenerative joint disease)    Hypertension  Past Surgical History:  Procedure Laterality Date   ABDOMINAL HYSTERECTOMY     TAH   APPENDECTOMY     CHOLECYSTECTOMY     FRACTURE SURGERY     right foot   KNEE ARTHROSCOPY     left   KNEE ARTHROSCOPY WITH LATERAL RELEASE  05/06/2012   Procedure: KNEE ARTHROSCOPY WITH LATERAL RELEASE;  Surgeon: Toribio JULIANNA Chancy, MD;  Location: [facility];  Service: Orthopedics;  Laterality: Right;  RIGHT KNEE ARTHROSCOPY WITH LATERAL RELEASE ,Excision Plica, Chondroplasty    SHOULDER ARTHROSCOPY     right   SHOULDER ARTHROSCOPY     left   TONSILLECTOMY     WRIST ARTHROPLASTY     left     Family History  Problem Relation Age of Onset   Pulmonary fibrosis Mother    COPD Mother    Asthma Mother    Stroke Father    Heart disease Brother     Social History   Socioeconomic History   Marital status: Single    Spouse name: Not on file   Number of children: Not on file   Years of education: Not on file   Highest education level: Not on file  Occupational History   Not on file  Tobacco Use   Smoking status: Every Day    Current packs/day: 0.25    Types: Cigarettes   Smokeless tobacco: Never  Vaping Use   Vaping status: Never Used  Substance and Sexual Activity   Alcohol use: Yes    Comment: Last date of consumption- 07.28.2025   Drug use: No   Sexual activity: Not on file  Other Topics Concern   Not on file  Social History Narrative   Not on file   Social Drivers of Health   Financial Resource Strain: Low Risk  (12/15/2023)   Received from [facility]   Overall Financial Resource Strain (CARDIA)    Difficulty of Paying Living Expenses: Not hard at all  Food Insecurity: No Food Insecurity (12/15/2023)   Received from [facility]   Hunger Vital Sign    Within the past 12 months, you worried that your food would run out before you got the money to buy more.: Never true    Within the past 12 months, the food you bought just didn't last and you didn't have money to get more.: Never true  Transportation Needs: No Transportation Needs (12/15/2023)   Received from [facility] - Transportation    Lack of Transportation (Medical): No    Lack of Transportation (Non-Medical): No  Physical Activity: Insufficiently Active (10/24/2022)   Received from [facility]   Exercise Vital Sign    On average, how many days per week do you engage in moderate to strenuous exercise (like a brisk walk)?: 2 days    On average, how many minutes do you engage in exercise at this level?: 20 min  Stress: No Stress Concern Present (10/24/2022)   Received from [facility] - Occupational Stress Questionnaire    Feeling of Stress : Only a little  Social Connections: Somewhat Isolated (10/24/2022)   Received from [facility]   Social Network    How would you rate your social network (family, work, friends)?: Restricted participation with some degree of social isolation  Intimate Partner Violence: Not At Risk (03/07/2023)   Humiliation, Afraid, Rape, and Kick questionnaire    Fear of Current or Ex-Partner: No  Emotionally Abused: No    Physically Abused: No    Sexually Abused: No    Review of Systems  Constitutional: Negative.   HENT: Negative.    Eyes: Negative.   Respiratory:  Negative for shortness of breath.   Cardiovascular:  Positive for leg swelling. Negative for chest pain.  Gastrointestinal: Negative.   Genitourinary: Negative.   Musculoskeletal:  Positive for myalgias.  Skin: Negative.   Neurological: Negative.   Endo/Heme/Allergies: Negative.   Psychiatric/Behavioral: Negative.          Objective    BP 136/76 (BP Location: Left Arm, Patient Position: Sitting, Cuff Size: Large)   Pulse 77   Ht 5' 7 (1.702 m)   Wt 153 lb (69.4 kg)   SpO2 97%   BMI 23.96 kg/m   Physical Exam Vitals and nursing note reviewed.  Constitutional:      Appearance: Normal appearance.  HENT:     Head: Normocephalic and atraumatic.     Right Ear: External ear normal.     Left Ear: External ear normal.     Nose: Nose normal.     Mouth/Throat:     Mouth: Mucous membranes are moist.     Pharynx: Oropharynx is clear.  Eyes:     Extraocular Movements: Extraocular movements intact.     Conjunctiva/sclera: Conjunctivae normal.     Pupils: Pupils are equal, round, and reactive to light.  Cardiovascular:     Rate and Rhythm: Normal rate and regular rhythm.     Pulses: Normal pulses.          Dorsalis pedis pulses are 2+ on the right side and 2+ on the left side.       Posterior tibial pulses are 2+  on the right side and 2+ on the left side.     Heart sounds: Normal heart sounds.     Comments: Very slight bilateral ankle edema noted Pulmonary:     Effort: Pulmonary effort is normal.     Breath sounds: Normal breath sounds.  Musculoskeletal:     Cervical back: Normal range of motion and neck supple.  Neurological:     Mental Status: She is alert.         Assessment & Plan:   Problem List Items Addressed This Visit       Cardiovascular and Mediastinum   Essential hypertension - Primary   Relevant Medications   amLODipine  (NORVASC ) 5 MG tablet   Other Relevant Orders   Basic Metabolic Panel (Completed)     Endocrine   Post-surgical hypothyroidism   Relevant Orders   Thyroid  Panel With TSH (Completed)     Other   H/O alcohol abuse   Vitamin D  insufficiency   Relevant Orders   Vitamin D , 25-hydroxy (Completed)   Assessment and Plan Primary hypertension with peripheral edema Blood pressure elevated with peripheral edema.  - Restart low dose amlodipine . - Monitor blood pressure daily and document readings. - Review blood pressure readings at next visit. Patient to follow-up with mobile unit in 2 weeks, fasting labs to be completed at that visit  Post-hemithyroidectomy hypothyroidism Thyroid  levels elevated during hospitalization. Current dose 150 mcg. Discussed stress and alcohol influence. - Order thyroid  function tests. - Evaluate thyroid  medication dosage after lab results.  Muscle cramps of feet Muscle cramps possibly due to dehydration, electrolyte imbalance, or reduced activity. Discussed overhydration and electrolyte loss. - Check potassium and other electrolytes. - Encourage stretching exercises for feet.   Vitamin D  deficiency (suspected) Vitamin  D deficiency noted, no supplementation provided. Possible contributor to muscle cramps. - Order vitamin D  level test.  Hyperlipidemia Cholesterol levels high during hospitalization, fasting status  unclear. - Schedule fasting lipid panel at next visit.   I have reviewed the patient's medical history (PMH, PSH, Social History, Family History, Medications, and allergies) , and have been updated if relevant. I spent 40 minutes reviewing chart and  face to face time with patient.    Return in about 2 weeks (around 02/15/2024) for With [facility].   Kirk RAMAN Mayers, PA-C

## 2024-02-03 ENCOUNTER — Encounter: Payer: Self-pay | Admitting: Physician Assistant

## 2024-02-03 ENCOUNTER — Ambulatory Visit: Payer: Self-pay | Admitting: Physician Assistant

## 2024-02-03 DIAGNOSIS — E559 Vitamin D deficiency, unspecified: Secondary | ICD-10-CM

## 2024-02-03 DIAGNOSIS — I10 Essential (primary) hypertension: Secondary | ICD-10-CM

## 2024-02-03 LAB — BASIC METABOLIC PANEL WITH GFR
BUN/Creatinine Ratio: 14 (ref 9–23)
BUN: 14 mg/dL (ref 6–24)
CO2: 18 mmol/L — ABNORMAL LOW (ref 20–29)
Calcium: 9.4 mg/dL (ref 8.7–10.2)
Chloride: 98 mmol/L (ref 96–106)
Creatinine, Ser: 1.03 mg/dL — ABNORMAL HIGH (ref 0.57–1.00)
Sodium: 142 mmol/L (ref 134–144)
eGFR: 63 mL/min/1.73 (ref >59)

## 2024-02-03 LAB — THYROID PANEL WITH TSH
Free Thyroxine Index: 1.9 (ref 1.2–4.9)
T3 Uptake Ratio: 24 % (ref 24–39)
T4, Total: 7.8 ug/dL (ref 4.5–12.0)
TSH: 0.645 u[IU]/mL (ref 0.450–4.500)

## 2024-02-03 LAB — VITAMIN D 25 HYDROXY (VIT D DEFICIENCY, FRACTURES): Vit D, 25-Hydroxy: 22.6 ng/mL — ABNORMAL LOW (ref 30.0–100.0)

## 2024-02-03 MED ORDER — VITAMIN D3 25 MCG (1000 UT) PO CAPS: 1000.0000 [IU] | ORAL_CAPSULE | Freq: Every day | ORAL | 1 refills | Status: AC

## 2024-02-22 ENCOUNTER — Encounter: Payer: Self-pay | Admitting: Physician Assistant

## 2024-02-22 ENCOUNTER — Ambulatory Visit: Admitting: Physician Assistant

## 2024-02-22 VITALS — BP 122/87 | HR 79 | Ht 67.0 in | Wt 154.0 lb

## 2024-02-22 DIAGNOSIS — E890 Postprocedural hypothyroidism: Secondary | ICD-10-CM

## 2024-02-22 DIAGNOSIS — E559 Vitamin D deficiency, unspecified: Secondary | ICD-10-CM

## 2024-02-22 DIAGNOSIS — F1011 Alcohol abuse, in remission: Secondary | ICD-10-CM

## 2024-02-22 DIAGNOSIS — I10 Essential (primary) hypertension: Secondary | ICD-10-CM

## 2024-02-22 MED ORDER — LEVOTHYROXINE SODIUM 150 MCG PO TABS: 150.0000 ug | ORAL_TABLET | Freq: Every day | ORAL | 1 refills | Status: AC

## 2024-02-22 MED ORDER — AMLODIPINE BESYLATE 5 MG PO TABS: 5.0000 mg | ORAL_TABLET | Freq: Every day | ORAL | 1 refills | Status: AC

## 2024-02-22 NOTE — Patient Instructions (Signed)
 VISIT SUMMARY:  Today, we reviewed your blood pressure management, vitamin D  levels, and thyroid  function. Your blood pressure is well-controlled with your current medication, and your thyroid  levels are normal.   YOUR PLAN:  -ESSENTIAL HYPERTENSION: Essential hypertension means high blood pressure without a known cause. Your blood pressure is well-controlled with your current medication, amlodipine . Continue taking your current dose and avoid salty foods to prevent swelling.  -VITAMIN D  DEFICIENCY: Vitamin D  deficiency means your body has lower than normal levels of vitamin D , which is important for bone health and overall well-being. Continue taking 1000 units of vitamin D  daily   -POSTPROCEDURAL HYPOTHYROIDISM: Postprocedural hypothyroidism means your thyroid  gland is underactive following a medical procedure. Your thyroid  levels are normal with your current dose of Synthroid . Continue taking your current dose, and a new prescription for Synthroid  has been sent to your pharmacy.  INSTRUCTIONS:  Please continue with your current medications as discussed. If you experience any new symptoms or have concerns, schedule a follow-up appointment. Safe travels and best of luck with your move to [place] !

## 2024-02-22 NOTE — Progress Notes (Unsigned)
 Established Patient Office Visit  Subjective   Patient ID: Jamie Johnson, female    DOB: February 27, 1966  Age: 58 y.o. MRN: [id number]  Chief Complaint  Patient presents with   Medication Refill   Medication Reaction    Patient is asking to have medications spaced throughout the day, she feels extremely tired after taking morning medications.  She is also requesting dosage decrease other medications.     Discussed the use of AI scribe software for clinical note transcription with the patient, who gave verbal consent to proceed.  History of Present Illness   Jamie Johnson is a 58 year old female with hypertension who presents for medication refills and follow-up on blood pressure management.  She is on a low dose of amlodipine , restarted due to previous swelling, with stable blood pressure readings of 112/78 mmHg and 122/87 mmHg. Swelling occurs only with high salt intake, which she avoids.  She experiences muscle cramps and takes 1000 units of vitamin D  daily due to previously low levels. She feels tired in the mornings after taking Lexapro  and gabapentin  and is considering adjusting the timing of her Lexapro  dose.  She is currently at [facility] where she is being treated for substance abuse and plans to move to [place]  after leaving the program at the end of September.  Physical Exam VITALS: BP- 122/87 GENERAL: Alert, cooperative, well developed, no acute distress. HEENT: Normocephalic, normal oropharynx, moist mucous membranes. CHEST: Clear to auscultation bilaterally, no wheezes, rhonchi, or crackles. CARDIOVASCULAR: Normal heart rate and rhythm, S1 and S2 normal without murmurs. ABDOMEN: Soft, non-tender, non-distended, without organomegaly, normal bowel sounds. EXTREMITIES: No cyanosis or edema. NEUROLOGICAL: Cranial nerves grossly intact, moves all extremities without gross motor or sensory deficit.  Results LABS Vitamin D : decreased Thyroid  panel: within normal  limits  Assessment and Plan Essential hypertension Blood pressure well-controlled with current medication. No edema unless consuming salty foods. - Continue current dose of amlodipine .  Vitamin D  deficiency Vitamin D  levels slightly below normal. Supplementation important for overall health. Encouraged continued supplementation despite relocation. - Continue taking 1000 units of vitamin D  daily. - Encourage taking a multivitamin daily even after moving to [place] .  Postprocedural hypothyroidism Thyroid  panel normal. Current Synthroid  dose effective. - Continue current dose of Synthroid . - Resend prescription for Synthroid .  HPI  Past Medical History:  Diagnosis Date   Arthritis    Complication of anesthesia    has very slow heartrate-drops with any anesthesia to 28   DJD (degenerative joint disease)    Hypertension    Social History   Socioeconomic History   Marital status: Single    Spouse name: Not on file   Number of children: Not on file   Years of education: Not on file   Highest education level: Not on file  Occupational History   Not on file  Tobacco Use   Smoking status: Every Day    Current packs/day: 0.25    Types: Cigarettes   Smokeless tobacco: Never  Vaping Use   Vaping status: Never Used  Substance and Sexual Activity   Alcohol use: Yes    Comment: Last date of consumption- 07.28.2025   Drug use: No   Sexual activity: Not on file  Other Topics Concern   Not on file  Social History Narrative   Not on file   Social Drivers of Health   Financial Resource Strain: Low Risk  (12/15/2023)   Received from [facility]   Overall Financial Resource Strain (CARDIA)  Difficulty of Paying Living Expenses: Not hard at all  Food Insecurity: No Food Insecurity (12/15/2023)   Received from [facility]   Hunger Vital Sign    Within the past 12 months, you worried that your food would run out before you got the money to buy more.: Never true    Within the  past 12 months, the food you bought just didn't last and you didn't have money to get more.: Never true  Transportation Needs: No Transportation Needs (12/15/2023)   Received from [facility] - Transportation    Lack of Transportation (Medical): No    Lack of Transportation (Non-Medical): No  Physical Activity: Insufficiently Active (10/24/2022)   Received from [facility]   Exercise Vital Sign    On average, how many days per week do you engage in moderate to strenuous exercise (like a brisk walk)?: 2 days    On average, how many minutes do you engage in exercise at this level?: 20 min  Stress: No Stress Concern Present (10/24/2022)   Received from [facility] - Occupational Stress Questionnaire    Feeling of Stress : Only a little  Social Connections: Somewhat Isolated (10/24/2022)   Received from [facility]   Social Network    How would you rate your social network (family, work, friends)?: Restricted participation with some degree of social isolation  Intimate Partner Violence: Not At Risk (03/07/2023)   Humiliation, Afraid, Rape, and Kick questionnaire    Fear of Current or Ex-Partner: No    Emotionally Abused: No    Physically Abused: No    Sexually Abused: No   Family History  Problem Relation Age of Onset   Pulmonary fibrosis Mother    COPD Mother    Asthma Mother    Stroke Father    Heart disease Brother    Allergies  Allergen Reactions   Pseudoephedrine Other (See Comments)    Made the patient feel dizzy and spacey    ROS    Objective:     BP 122/87 (BP Location: Left Arm, Patient Position: Sitting, Cuff Size: Large)   Pulse 79   Ht 5' 7 (1.702 m)   Wt 154 lb (69.9 kg)   SpO2 98%   BMI 24.12 kg/m  BP Readings from Last 3 Encounters:  02/22/24 122/87  02/01/24 136/76  12/10/23 (!) 152/85   Wt Readings from Last 3 Encounters:  02/22/24 154 lb (69.9 kg)  02/01/24 153 lb (69.4 kg)  12/10/23  130 lb (59 kg)    Physical Exam      Assessment & Plan:   Problem List Items Addressed This Visit   None   No follow-ups on file.    Kirk RAMAN Mayers, PA-C

## 2024-03-04 DIAGNOSIS — F1020 Alcohol dependence, uncomplicated: Secondary | ICD-10-CM | POA: Diagnosis not present

## 2024-03-07 ENCOUNTER — Ambulatory Visit: Admitting: Physician Assistant

## 2024-03-07 DIAGNOSIS — Z13220 Encounter for screening for lipoid disorders: Secondary | ICD-10-CM

## 2024-03-07 DIAGNOSIS — I10 Essential (primary) hypertension: Secondary | ICD-10-CM

## 2024-03-07 NOTE — Progress Notes (Signed)
 Pt is in the clinic for repeat labs only.

## 2024-03-08 LAB — LIPID PANEL
Chol/HDL Ratio: 2.8 ratio (ref 0.0–4.4)
Cholesterol, Total: 183 mg/dL (ref 100–199)
HDL: 66 mg/dL (ref >39)
LDL Chol Calc (NIH): 103 mg/dL — ABNORMAL HIGH (ref 0–99)
Triglycerides: 75 mg/dL (ref 0–149)
VLDL Cholesterol Cal: 14 mg/dL (ref 5–40)

## 2024-03-10 ENCOUNTER — Ambulatory Visit: Payer: Self-pay | Admitting: Physician Assistant

## 2024-03-10 DIAGNOSIS — F1020 Alcohol dependence, uncomplicated: Secondary | ICD-10-CM | POA: Diagnosis not present
